# Patient Record
Sex: Male | Born: 1955 | Race: Asian | Hispanic: No | Marital: Married | Smoking: Current some day smoker
Health system: Southern US, Community
[De-identification: ages and names within clinical notes are randomized; demographics above are authoritative.]

---

## 2015-11-18 ENCOUNTER — Ambulatory Visit (INDEPENDENT_AMBULATORY_CARE_PROVIDER_SITE_OTHER): Payer: BLUE CROSS/BLUE SHIELD | Admitting: Family Medicine

## 2015-11-18 ENCOUNTER — Encounter: Payer: Self-pay | Admitting: Family Medicine

## 2015-11-18 VITALS — BP 130/86 | HR 71 | Temp 98.1°F | Resp 17 | Ht 66.0 in | Wt 124.0 lb

## 2015-11-18 DIAGNOSIS — B029 Zoster without complications: Secondary | ICD-10-CM | POA: Diagnosis not present

## 2015-11-18 MED ORDER — TRAMADOL HCL 50 MG PO TABS: 50.0000 mg | ORAL_TABLET | Freq: Three times a day (TID) | ORAL | 0 refills | Status: DC | PRN

## 2015-11-18 MED ORDER — VALACYCLOVIR HCL 1 G PO TABS: 1000.0000 mg | ORAL_TABLET | Freq: Three times a day (TID) | ORAL | 0 refills | Status: DC

## 2015-11-18 NOTE — Patient Instructions (Addendum)
There are 2 medicines.  THe first is called valacyclovir.  Take this 3 times a day for 10 days to keep the rash from spreading.   The other medicine is called Tramadol.  You can take this every 6 hours for pain relief.  If you have mild pain, take the Ibuprofen.    It was good to see you today.  If you are still having issues after 10 days, come back and see us.      IF you received an x-ray today, you will receive an invoice from [facility]. Please contact [facility] at [phone] with questions or concerns regarding your invoice.   IF you received labwork today, you will receive an invoice from [facility]. Please contact [facility] at [phone] with questions or concerns regarding your invoice.   Our billing staff will not be able to assist you with questions regarding bills from these companies.  You will be contacted with the lab results as soon as they are available. The fastest way to get your results is to activate your My Chart account. Instructions are located on the last page of this paperwork. If you have not heard from us regarding the results in 2 weeks, please contact this office.

## 2015-11-18 NOTE — Progress Notes (Signed)
   Derek Lucero is a 60 y.o. male who presents to [facility] today for neck pain and rash:  1.  Neck pain and neck rash:  Present for past week or so. Right side of neck.  Describes pain here and also blisters which started on side of neck, radiates along neck and up to back of his Right ear.  Patient has history of cervical spine neck pain.  Neck pain started first and he assumed this was usual neck pain.  Started used herbal oils and patch over side of his neck as he has done frequently in the past.  He noticed burning after using the oils and patches -- blisters started 2 days after that, which was about 5 days ago. He is taking OTC ibuprofen with some relief of his pain.   No pain in ears.  NO hearing changes.  NO tinnitus.  NO lacrimation issues.  No facial paralysis    PMH:  [place] from [place] on April [place], [place].  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  No known history of being immunocompromised.   ROS as above.  Otherwise, no fevers or chills.  No nausea/vomiting.  No chest pain.  No radiation of pain to extremity.    Family history significant only for MSK pain but otherwise denies any medical concerns.  States parents still alive and healthy.   PMH reviewed. Patient is a nonsmoker.   No past medical history on file. No past surgical history on file.  Medications reviewed. Current Outpatient Prescriptions  Medication Sig Dispense Refill  . glucosamine-chondroitin 500-400 MG tablet Take 1 tablet by mouth 3 (three) times daily.     No current facility-administered medications for this visit.      Physical Exam:  BP 130/86 (BP Location: Right Arm, Patient Position: Sitting, Cuff Size: Normal)   Pulse 71   Temp 98.1 F (36.7 C) (Oral)   Resp 17   Ht 5\' 6"  (1.676 m)   Wt 124 lb (56.2 kg)   SpO2 99%   BMI 20.01 kg/m  Gen:  Alert, cooperative patient who appears stated age in no acute distress.  Vital signs reviewed. HEENT: EOMI,  MMM.  Left ear WNL.  Right ear with some  vesicles noted behind the ear.  NO vesicles on the pinna itself.  No vesicles on face. Neck:  No neck stiffness, supple.   Pulm:  Clear to auscultation bilaterally with good air movement.  No wheezes or rales noted.   Cardiac:  Regular rate and rhythm  Skin:  Multiple patches of erythematous vesicles scattered across Right side of neck and extending up to posterior to RIght ear.  Somewhat painful to touch.  He also has one patch that has extended to level of clavicle.     Assessment and Plan:  1.  Shingles: - vesicular lesions in cervical dermatomal distribution make this most likely to be shingles.  - as it sounds that he's having new blisters, plan to treat with Valtrex.  - Tramadol for pain relief - FU in 10 days if no improvement.  FU sooner if worsening.    Entire visit took 30 minutes of face time to get full history and PMH due to language barrier.

## 2017-02-17 ENCOUNTER — Encounter: Payer: Self-pay | Admitting: Emergency Medicine

## 2017-02-17 ENCOUNTER — Ambulatory Visit (INDEPENDENT_AMBULATORY_CARE_PROVIDER_SITE_OTHER): Payer: BLUE CROSS/BLUE SHIELD | Admitting: Emergency Medicine

## 2017-02-17 ENCOUNTER — Other Ambulatory Visit: Payer: Self-pay

## 2017-02-17 VITALS — BP 130/96 | HR 99 | Temp 97.4°F | Resp 20 | Ht 63.5 in | Wt 132.1 lb

## 2017-02-17 DIAGNOSIS — J209 Acute bronchitis, unspecified: Secondary | ICD-10-CM

## 2017-02-17 DIAGNOSIS — R05 Cough: Secondary | ICD-10-CM | POA: Diagnosis not present

## 2017-02-17 DIAGNOSIS — R059 Cough, unspecified: Secondary | ICD-10-CM | POA: Insufficient documentation

## 2017-02-17 MED ORDER — BENZONATATE 200 MG PO CAPS: 200.0000 mg | ORAL_CAPSULE | Freq: Two times a day (BID) | ORAL | 0 refills | Status: DC | PRN

## 2017-02-17 MED ORDER — AZITHROMYCIN 250 MG PO TABS: ORAL_TABLET | ORAL | 0 refills | Status: DC

## 2017-02-17 NOTE — Progress Notes (Signed)
Derek Lucero 61 y.o.   Chief Complaint  Patient presents with  . Cough     x 1 week    HISTORY OF PRESENT ILLNESS: This is a 61 y.o. male complaining of cough x 1 week. Interpreter Vance [phone] used.  Cough  This is a new problem. The current episode started in the past 7 days. The problem has been gradually worsening. The problem occurs every few minutes. The cough is productive of sputum. Pertinent negatives include no chest pain, chills, ear congestion, fever, headaches, heartburn, hemoptysis, nasal congestion, rash, sore throat, shortness of breath, weight loss or wheezing. He has tried nothing for the symptoms. His past medical history is significant for bronchitis. There is no history of asthma or COPD.     Prior to Admission medications   Medication Sig Start Date End Date Taking? Authorizing Provider  glucosamine-chondroitin 500-400 MG tablet Take 1 tablet by mouth 3 (three) times daily.    [provider]  traMADol (ULTRAM) 50 MG tablet Take 1 tablet (50 mg total) by mouth every 8 (eight) hours as needed. 11/18/15   Tobey Grim, MD  valACYclovir (VALTREX) 1000 MG tablet Take 1 tablet (1,000 mg total) by mouth 3 (three) times daily. 11/18/15   Tobey Grim, MD    No Known Allergies  There are no active problems to display for this patient.   No past medical history on file.    Social History   Socioeconomic History  . Marital status: Married    Spouse name: Not on file  . Number of children: Not on file  . Years of education: Not on file  . Highest education level: Not on file  Social Needs  . Financial resource strain: Not on file  . Food insecurity - worry: Not on file  . Food insecurity - inability: Not on file  . Transportation needs - medical: Not on file  . Transportation needs - non-medical: Not on file  Occupational History  . Not on file  Tobacco Use  . Smoking status: Former Smoker    Types: Cigarettes  . Smokeless tobacco: Never Used    Substance and Sexual Activity  . Alcohol use: Not on file  . Drug use: Not on file  . Sexual activity: Not on file  Other Topics Concern  . Not on file  Social History Narrative  . Not on file    No family history on file.   Review of Systems  Constitutional: Negative.  Negative for chills, fever and weight loss.  HENT: Negative.  Negative for congestion, sinus pain and sore throat.   Eyes: Negative.   Respiratory: Positive for cough and sputum production. Negative for hemoptysis, shortness of breath and wheezing.   Cardiovascular: Negative for chest pain and palpitations.  Gastrointestinal: Negative for abdominal pain, diarrhea, heartburn, nausea and vomiting.  Genitourinary: Negative.  Negative for dysuria and hematuria.  Musculoskeletal: Negative.   Skin: Negative.  Negative for rash.  Neurological: Negative for dizziness and headaches.  Endo/Heme/Allergies: Negative.   All other systems reviewed and are negative.   Vitals:   02/17/17 1400  BP: (!) 130/96  Pulse: 99  Resp: 20  Temp: (!) 97.4 F (36.3 C)  SpO2: 98%    Physical Exam  Constitutional: He is oriented to person, place, and time. He appears well-developed and well-nourished.  HENT:  Head: Normocephalic and atraumatic.  Right Ear: External ear normal.  Left Ear: External ear normal.  Nose: Nose normal.  Mouth/Throat: Oropharynx  is clear and moist.  Eyes: Conjunctivae and EOM are normal. Pupils are equal, round, and reactive to light.  Neck: Normal range of motion. Neck supple. No JVD present. No thyromegaly present.  Cardiovascular: Normal rate, regular rhythm, normal heart sounds and intact distal pulses.  Pulmonary/Chest: Effort normal and breath sounds normal. No respiratory distress.  Abdominal: Soft. Bowel sounds are normal. He exhibits no distension. There is no tenderness.  Musculoskeletal: Normal range of motion.  Lymphadenopathy:    He has no cervical adenopathy.  Neurological: He is alert  and oriented to person, place, and time. No sensory deficit. He exhibits normal muscle tone. Coordination normal.  Skin: Skin is warm and dry. Capillary refill takes less than 2 seconds. No rash noted.  Psychiatric: He has a normal mood and affect. His behavior is normal.  Vitals reviewed.    ASSESSMENT & PLAN: [name] was seen today for cough.  Diagnoses and all orders for this visit:  Acute bronchitis, unspecified organism -     azithromycin (ZITHROMAX) 250 MG tablet; Sig as indicated  Cough -     benzonatate (TESSALON) 200 MG capsule; Take 1 capsule (200 mg total) by mouth 2 (two) times daily as needed for cough.    Patient Instructions  Acute Bronchitis, Adult Acute bronchitis is when air tubes (bronchi) in the lungs suddenly get swollen. The condition can make it hard to breathe. It can also cause these symptoms:  A cough.  Coughing up clear, yellow, or green mucus.  Wheezing.  Chest congestion.  Shortness of breath.  A fever.  Body aches.  Chills.  A sore throat.  Follow these instructions at home: Medicines  Take over-the-counter and prescription medicines only as told by your doctor.  If you were prescribed an antibiotic medicine, take it as told by your doctor. Do not stop taking the antibiotic even if you start to feel better. General instructions  Rest.  Drink enough fluids to keep your pee (urine) clear or pale yellow.  Avoid smoking and secondhand smoke. If you smoke and you need help quitting, ask your doctor. Quitting will help your lungs heal faster.  Use an inhaler, cool mist vaporizer, or humidifier as told by your doctor.  Keep all follow-up visits as told by your doctor. This is important. How is this prevented? To lower your risk of getting this condition again:  Wash your hands often with soap and water. If you cannot use soap and water, use hand sanitizer.  Avoid contact with people who have cold symptoms.  Try not to touch your  hands to your mouth, nose, or eyes.  Make sure to get the flu shot every year.  Contact a doctor if:  Your symptoms do not get better in 2 weeks. Get help right away if:  You cough up blood.  You have chest pain.  You have very bad shortness of breath.  You become dehydrated.  You faint (pass out) or keep feeling like you are going to pass out.  You keep throwing up (vomiting).  You have a very bad headache.  Your fever or chills gets worse. This information is not intended to replace advice given to you by your health care provider. Make sure you discuss any questions you have with your health care provider. Document Released: 07/26/2007 Document Revised: 09/15/2015 Document Reviewed: 07/28/2015 Elsevier Interactive Patient Education  2018 Elsevier Inc.  Vim ph? qu?n c?p tnh, Ng??i l?n Acute Bronchitis, Adult Vim ph? qu?n c?p tnh l tnh tr?ng  s?ng ??t ng?t (c?p tnh) cc ?ng d?n kh (ph? qu?n) trong ph?i. Vim ph? qu?n c?p tnh lm cho cc ?ng ny ??y d?ch nh?y, ?i?u ny c th? d?n ??n kh th?. B?nh c?ng c th? gy ho ho?c th? kh kh. ? ng??i l?n, vim ph? qu?n c?p tnh th??ng kh?i trong vng 2 tu?n. Ho do vim ph? qu?n c th? ko di ??n 3 tu?n. Ht thu?c, d? ?ng, v hen suy?n c th? lm cho tnh tr?ng ny tr?m tr?ng h?n. Nh?ng ??t vim ph? qu?n l?p ?i l?p l?i c th? gy thm cc v?n ?? ? ph?i, ch?ng h?n nh? b?nh ph?i t?c ngh?n m?n tnh (COPD). Nguyn nhn g gy ra? Tnh tr?ng ny c th? do cc m?m b?nh v cc ch?t gy kch ?ng ph?i gy ra, bao g?m:  Vi rt gy c?m l?nh v cm. Tnh tr?ng ny ph?n l?n th??ng do cng m?t lo?i vi rt gy c?m l?nh gy ra.  Vi khu?n.  Ti?p xc v?i khi thu?c l, b?i, khi v  nhi?m khng kh.  ?i?u g lm t?ng nguy c?? Tnh tr?ng ny hay x?y ra h?n ? nh?ng ng??i:  Ti?p xc g?n g?i v?i ai ? b? vim ph? qu?n c?p tnh.  Ti?p xc v?i nh?ng ch?t gy kch ?ng ph?i, ch?ng h?n nh? khi thu?c l, b?i, khi v h?i n??c.  C h? mi?n d?ch  y?u.  C tnh tr?ng b?nh l v? h h?p nh? hen suy?n.  Cc d?u hi?u ho?c tri?u ch?ng l g? Nh?ng tri?u ch?ng c?a tnh tr?ng ny bao g?m:  Ho.  Ho ra d?ch nh?y trong, mu vng ho?c mu xanh l cy.  Th? kh kh.  T?c ng?c.  Kh th?.  S?t.  ?au nh?c c? th?.  ?n l?nh.  ?au h?ng.  Ch?n ?on tnh tr?ng ny nh? th? no? Tnh tr?ng ny th???ng ???c ch?n ?on b?ng cch khm th?c th?Suzzette Righter qu trnh Loren Racer, chuyn gia ch?m [place] s?c kh?e c?a qu v? c th? yu c?u cc xt nghi?m, ch?ng h?n nh? ch?p X-quang ng?c, ?? lo?i b? cc tnh tr?ng khc. Chuyn gia c?ng c th?:  Xt nghi?m m?u d?ch nh?y ?? xem c b? nhi?m vi khu?n khng.  Ki?m tra n?ng ?? oxi trong mu. Vi?c ny ???c th?c hi?n ?? xem tr? c b? vim ph?i khng.  Ch?p X-quang ng?c ho?c ki?m tra ch?c n?ng ph?i ?? lo?i tr? vim ph?i v nh?ng tnh tr?ng khc.  Xt nghi?m mu.  Chuyn gia ch?m [place] s?c kh?e c?a qu v? c?ng s? h?i v? cc tri?u ch?ng v b?nh s? c?a qu v?. Tnh tr?ng ny ???c ?i?u tr? nh? th? no? H?u h?t cc tr??ng h?p vim ph? qu?n c?p tnh ??u kh?i theo th?i gian m khng c?n ?i?u tr?Shaune Pascal gia ch?m [place] s?c kh?e c?a qu v? c th? khuy?n ngh?:  U?ng nhi?u n??c h?n. U?ng nhi?u n??c h?n gip d?ch nh?y long h?n, c th? lm cho qu v? th? d? dng h?n.  U?ng thu?c gi?m s?t ho?c gi?m ho.  U?ng thu?c khng sinh.  S? d?ng my kh dung ?? c?i thi?n tnh tr?ng kh th? v ki?m sot ho.  S? d?ng my phun h?i n??c mt ho?c my ?i?u ?m ?? lm cho qu v? th? d? dng h?n.  Tun th? nh?ng h??ng d?n ny ? nh: Thu?c  Ch? s? d?ng thu?c khng k ??n v thu?c k ??n theo ch? d?n c?a chuyn gia ch?m [place] s?c kh?e.  N?u qu v? ???c k thu?c khng sinh, [place] dng thu?c theo ch? d?n c?a chuyn gia ch?m [place] s?c kh?e. Khng d?ng u?ng thu?c khng sinh ngay c? khi qu v? b?t ??u c?m th?y ?? h?n. H??ng d?n chung  Ngh? ng?i th?t nhi?u.  U?ng ?? n??c ?? gi? cho n??c ti?u trong ho?c vng nh?t.  Trnh ht thu?c l v khi thu?c l th?  ??ng. Ti?p xc v?i khi thu?c l ho?c cc ha ch?t kch thch s? lm cho vim ph? qu?n n?ng h?n. N?u qu v? ht thu?c v c?n gip ?? ?? b? thu?c l, [place] h?i chuyn gia ch?m [place] s?c kh?e. B? ht thu?c s? gip ph?i bnh ph?c nhanh h?n.  S? d?ng my kh dung, my phun h?i n??c mt ho?c my ?i?u ?m theo ch? d?n c?a chuyn gia ch?m [place] s?c kh?e.  Tun th? t?t c? cc l?n khm theo di theo ch? d?n c?a chuyn gia ch?m [place] s?c kh?e. ?i?u ny c vai tr quan tr?ng. Ng?n ng?a tnh tr?ng ny b?ng cch no? ?? gi?m nguy c? m?c l?i tnh tr?ng ny:  R?a tay th???ng xuyn b?ng x phng v n??c. N?u khng c x phng v n??c, Jamill dng thu?c st trng tay.  Trnh ti?p xc v?i nh?ng ng??i c tri?u ch?ng c?m l?nh.  C? g?ng khng ch?m tay ln mi?ng, m?i, ho?c m?t.  ??m b?o tim phng cm hng n?m.  Aahil lin l?c v?i chuyn gia ch?m [place] s?c kh?e n?u:  Cc tri?u ch?ng c?a qu v? khng c?i thi?n sau 2 tu?n ?i?u tr?Cathie Hoops. Yu c?u tr? gip ngay l?p t?c n?u:  Qu v? ho ra mu.  Qu v? b? ?au ng?c.  Qu v? b? kh th? r?t nhi?u.  Qu v? b? m?t n??c.  Qu v? ng?t x?u ho?c qu v? c?m th?y nh? l s?p ng?t x?u.  Qu v? ti?p t?c nn m?a.  Qu v? b? ?au ??u nhi?u.  Tnh tr?ng s?t ho?c ?n l?nh c?a qu v? tr?m tr?ng h?n. Thng tin ny khng nh?m m?c ?ch thay th? cho l?i khuyn m chuyn gia ch?m [place] s?c kh?e ni v?i qu v?. [place] b?o ??m qu v? ph?i th?o lu?n b?t k? v?n ?? g m qu v? c v?i chuyn gia ch?m  s?c kh?e c?a qu v?. Document Released: 03/05/2015 Document Revised: 05/12/2016 Document Reviewed: 07/28/2015 Elsevier Interactive Patient Education  2018 Elsevier Inc.      Edwina BarthMiguel Jowanna Loeffler, MD [facility]

## 2017-02-17 NOTE — Patient Instructions (Addendum)
Acute Bronchitis, Adult Acute bronchitis is when air tubes (bronchi) in the lungs suddenly get swollen. The condition can make it hard to breathe. It can also cause these symptoms:  A cough.  Coughing up clear, yellow, or green mucus.  Wheezing.  Chest congestion.  Shortness of breath.  A fever.  Body aches.  Chills.  A sore throat.  Follow these instructions at home: Medicines  Take over-the-counter and prescription medicines only as told by your doctor.  If you were prescribed an antibiotic medicine, take it as told by your doctor. Do not stop taking the antibiotic even if you start to feel better. General instructions  Rest.  Drink enough fluids to keep your pee (urine) clear or pale yellow.  Avoid smoking and secondhand smoke. If you smoke and you need help quitting, ask your doctor. Quitting will help your lungs heal faster.  Use an inhaler, cool mist vaporizer, or humidifier as told by your doctor.  Keep all follow-up visits as told by your doctor. This is important. How is this prevented? To lower your risk of getting this condition again:  Wash your hands often with soap and water. If you cannot use soap and water, use hand sanitizer.  Avoid contact with people who have cold symptoms.  Try not to touch your hands to your mouth, nose, or eyes.  Make sure to get the flu shot every year.  Contact a doctor if:  Your symptoms do not get better in 2 weeks. Get help right away if:  You cough up blood.  You have chest pain.  You have very bad shortness of breath.  You become dehydrated.  You faint (pass out) or keep feeling like you are going to pass out.  You keep throwing up (vomiting).  You have a very bad headache.  Your fever or chills gets worse. This information is not intended to replace advice given to you by your health care provider. Make sure you discuss any questions you have with your health care provider. Document Released:  07/26/2007 Document Revised: 09/15/2015 Document Reviewed: 07/28/2015 Elsevier Interactive Patient Education  2018 Elsevier Inc.  Vim ph? qu?n c?p tnh, Ng??i l?n Acute Bronchitis, Adult Vim ph? qu?n c?p tnh l tnh tr?ng s?ng ??t ng?t (c?p tnh) cc ?ng d?n kh (ph? qu?n) trong ph?i. Vim ph? qu?n c?p tnh lm cho cc ?ng ny ??y d?ch nh?y, ?i?u ny c th? d?n ??n kh th?. B?nh c?ng c th? gy ho ho?c th? kh kh. ? ng??i l?n, vim ph? qu?n c?p tnh th??ng kh?i trong vng 2 tu?n. Ho do vim ph? qu?n c th? ko di ??n 3 tu?n. Ht thu?c, d? ?ng, v hen suy?n c th? lm cho tnh tr?ng ny tr?m tr?ng h?n. Nh?ng ??t vim ph? qu?n l?p ?i l?p l?i c th? gy thm cc v?n ?? ? ph?i, ch?ng h?n nh? b?nh ph?i t?c ngh?n m?n tnh (COPD). Nguyn nhn g gy ra? Tnh tr?ng ny c th? do cc m?m b?nh v cc ch?t gy kch ?ng ph?i gy ra, bao g?m:  Vi rt gy c?m l?nh v cm. Tnh tr?ng ny ph?n l?n th??ng do cng m?t lo?i vi rt gy c?m l?nh gy ra.  Vi khu?n.  Ti?p xc v?i khi thu?c l, b?i, khi v  nhi?m khng kh.  ?i?u g lm t?ng nguy c?? Tnh tr?ng ny hay x?y ra h?n ? nh?ng ng??i:  Ti?p xc g?n g?i v?i ai ? b? vim ph? qu?n c?p tnh.  Ti?p xc  v?i nh?ng ch?t gy kch ?ng ph?i, ch?ng h?n nh? khi thu?c l, b?i, khi v h?i n??c.  C h? mi?n d?ch y?u.  C tnh tr?ng b?nh l v? h h?p nh? hen suy?n.  Cc d?u hi?u ho?c tri?u ch?ng l g? Nh?ng tri?u ch?ng c?a tnh tr?ng ny bao g?m:  Ho.  Ho ra d?ch nh?y trong, mu vng ho?c mu xanh l cy.  Th? kh kh.  T?c ng?c.  Kh th?.  S?t.  ?au nh?c c? th?.  ?n l?nh.  ?au h?ng.  Ch?n ?on tnh tr?ng ny nh? th? no? Tnh tr?ng ny th???ng ???c ch?n ?on b?ng cch khm th?c th?Suzzette Righter. Trong qu trnh Loren Racerkhm, chuyn gia ch?m [place] s?c kh?e c?a qu v? c th? yu c?u cc xt nghi?m, ch?ng h?n nh? ch?p X-quang ng?c, ?? lo?i b? cc tnh tr?ng khc. Chuyn gia c?ng c th?:  Xt nghi?m m?u d?ch nh?y ?? xem c b? nhi?m vi khu?n khng.  Ki?m tra  n?ng ?? oxi trong mu. Vi?c ny ???c th?c hi?n ?? xem tr? c b? vim ph?i khng.  Ch?p X-quang ng?c ho?c ki?m tra ch?c n?ng ph?i ?? lo?i tr? vim ph?i v nh?ng tnh tr?ng khc.  Xt nghi?m mu.  Chuyn gia ch?m [place] s?c kh?e c?a qu v? c?ng s? h?i v? cc tri?u ch?ng v b?nh s? c?a qu v?. Tnh tr?ng ny ???c ?i?u tr? nh? th? no? H?u h?t cc tr??ng h?p vim ph? qu?n c?p tnh ??u kh?i theo th?i gian m khng c?n ?i?u tr?Shaune Pascal. Chuyn gia ch?m [place] s?c kh?e c?a qu v? c th? khuy?n ngh?:  U?ng nhi?u n??c h?n. U?ng nhi?u n??c h?n gip d?ch nh?y long h?n, c th? lm cho qu v? th? d? dng h?n.  U?ng thu?c gi?m s?t ho?c gi?m ho.  U?ng thu?c khng sinh.  S? d?ng my kh dung ?? c?i thi?n tnh tr?ng kh th? v ki?m sot ho.  S? d?ng my phun h?i n??c mt ho?c my ?i?u ?m ?? lm cho qu v? th? d? dng h?n.  Tun th? nh?ng h??ng d?n ny ? nh: Thu?c  Ch? s? d?ng thu?c khng k ??n v thu?c k ??n theo ch? d?n c?a chuyn gia ch?m [place] s?c kh?e.  N?u qu v? ???c k thu?c khng sinh, [place] dng thu?c theo ch? d?n c?a chuyn gia ch?m [place] s?c kh?e. Khng d?ng u?ng thu?c khng sinh ngay c? khi qu v? b?t ??u c?m th?y ?? h?n. H??ng d?n chung  Ngh? ng?i th?t nhi?u.  U?ng ?? n??c ?? gi? cho n??c ti?u trong ho?c vng nh?t.  Trnh ht thu?c l v khi thu?c l th? ??ng. Ti?p xc v?i khi thu?c l ho?c cc ha ch?t kch thch s? lm cho vim ph? qu?n n?ng h?n. N?u qu v? ht thu?c v c?n gip ?? ?? b? thu?c l, Naif h?i chuyn gia ch?m [place] s?c kh?e. B? ht thu?c s? gip ph?i bnh ph?c nhanh h?n.  S? d?ng my kh dung, my phun h?i n??c mt ho?c my ?i?u ?m theo ch? d?n c?a chuyn gia ch?m [place] s?c kh?e.  Tun th? t?t c? cc l?n khm theo di theo ch? d?n c?a chuyn gia ch?m [place] s?c kh?e. ?i?u ny c vai tr quan tr?ng. Ng?n ng?a tnh tr?ng ny b?ng cch no? ?? gi?m nguy c? m?c l?i tnh tr?ng ny:  R?a tay th???ng xuyn b?ng x phng v n??c. N?u khng c x phng v n??c, Meryl dng thu?c st trng  tay.  Young Berryrnh  ti?p xc v?i nh?ng ng??i c tri?u ch?ng c?m l?nh.  C? g?ng khng ch?m tay ln mi?ng, m?i, ho?c m?t.  ??m b?o tim phng cm hng n?m.  [place] lin l?c v?i chuyn gia ch?m [place] s?c kh?e n?u:  Cc tri?u ch?ng c?a qu v? khng c?i thi?n sau 2 tu?n ?i?u tr?Cathie Hoops c?u tr? gip ngay l?p t?c n?u:  Qu v? ho ra mu.  Qu v? b? ?au ng?c.  Qu v? b? kh th? r?t nhi?u.  Qu v? b? m?t n??c.  Qu v? ng?t x?u ho?c qu v? c?m th?y nh? l s?p ng?t x?u.  Qu v? ti?p t?c nn m?a.  Qu v? b? ?au ??u nhi?u.  Tnh tr?ng s?t ho?c ?n l?nh c?a qu v? tr?m tr?ng h?n. Thng tin ny khng nh?m m?c ?ch thay th? cho l?i khuyn m chuyn gia ch?m [place] s?c kh?e ni v?i qu v?. [place] b?o ??m qu v? ph?i th?o lu?n b?t k? v?n ?? g m qu v? c v?i chuyn gia ch?m [place] s?c kh?e c?a qu v?. Document Released: 03/05/2015 Document Revised: 05/12/2016 Document Reviewed: 07/28/2015 Elsevier Interactive Patient Education  2018 [employer].

## 2017-04-04 ENCOUNTER — Encounter: Payer: Self-pay | Admitting: Emergency Medicine

## 2017-04-04 ENCOUNTER — Ambulatory Visit (INDEPENDENT_AMBULATORY_CARE_PROVIDER_SITE_OTHER): Payer: BLUE CROSS/BLUE SHIELD | Admitting: Emergency Medicine

## 2017-04-04 ENCOUNTER — Ambulatory Visit (INDEPENDENT_AMBULATORY_CARE_PROVIDER_SITE_OTHER): Payer: BLUE CROSS/BLUE SHIELD

## 2017-04-04 ENCOUNTER — Other Ambulatory Visit: Payer: Self-pay

## 2017-04-04 VITALS — BP 140/86 | HR 65 | Temp 98.1°F | Resp 16 | Ht 63.5 in | Wt 131.4 lb

## 2017-04-04 DIAGNOSIS — M25512 Pain in left shoulder: Secondary | ICD-10-CM

## 2017-04-04 DIAGNOSIS — R079 Chest pain, unspecified: Secondary | ICD-10-CM | POA: Insufficient documentation

## 2017-04-04 DIAGNOSIS — M549 Dorsalgia, unspecified: Secondary | ICD-10-CM | POA: Diagnosis not present

## 2017-04-04 DIAGNOSIS — M7918 Myalgia, other site: Secondary | ICD-10-CM | POA: Insufficient documentation

## 2017-04-04 DIAGNOSIS — M25511 Pain in right shoulder: Secondary | ICD-10-CM | POA: Insufficient documentation

## 2017-04-04 NOTE — Progress Notes (Signed)
Yandriel Cangelosi 62 y.o.   Chief Complaint  Patient presents with  . Chest Pain    x 2 days  . Shoulder Pain    LEFT    HISTORY OF PRESENT ILLNESS: This is a 62 y.o. male with no significant past medical history complaining of a 2-day history of constant pain to the left upper back left shoulder left side of the chest with no other significant symptomatology.  Denies injuries.  Pain is steady and sharp.  Worse with movement.  Seems comfortable at present time.  Speaks [place]  Interpreter Elease Hashimoto) used during the interview.  HPI   Prior to Admission medications   Medication Sig Start Date End Date Taking? Authorizing Provider  glucosamine-chondroitin 500-400 MG tablet Take 1 tablet by mouth 3 (three) times daily.   Yes [provider]  IBUPROFEN PO Take by mouth daily.   Yes [provider]  azithromycin (ZITHROMAX) 250 MG tablet Sig as indicated Patient not taking: Reported on 04/04/2017 02/17/17   Georgina Quint, MD  benzonatate (TESSALON) 200 MG capsule Take 1 capsule (200 mg total) by mouth 2 (two) times daily as needed for cough. Patient not taking: Reported on 04/04/2017 02/17/17   Georgina Quint, MD  dextromethorphan [facility]) 30 MG/5ML liquid Take 30 mg by mouth as needed for cough.    [provider]  traMADol (ULTRAM) 50 MG tablet Take 1 tablet (50 mg total) by mouth every 8 (eight) hours as needed. Patient not taking: Reported on 02/17/2017 11/18/15   Tobey Grim, MD  valACYclovir (VALTREX) 1000 MG tablet Take 1 tablet (1,000 mg total) by mouth 3 (three) times daily. Patient not taking: Reported on 02/17/2017 11/18/15   Tobey Grim, MD    No Known Allergies  Patient Active Problem List   Diagnosis Date Noted  . Cough 02/17/2017  . Acute bronchitis 02/17/2017    No past medical history on file.    Social History   Socioeconomic History  . Marital status: Married    Spouse name: Not on file  . Number of children:  Not on file  . Years of education: Not on file  . Highest education level: Not on file  Social Needs  . Financial resource strain: Not on file  . Food insecurity - worry: Not on file  . Food insecurity - inability: Not on file  . Transportation needs - medical: Not on file  . Transportation needs - non-medical: Not on file  Occupational History  . Not on file  Tobacco Use  . Smoking status: Former Smoker    Types: Cigarettes  . Smokeless tobacco: Never Used  Substance and Sexual Activity  . Alcohol use: Not on file  . Drug use: Not on file  . Sexual activity: Not on file  Other Topics Concern  . Not on file  Social History Narrative  . Not on file    No family history on file.   Review of Systems  Constitutional: Negative.  Negative for chills and fever.  HENT: Negative.  Negative for congestion, nosebleeds and sore throat.   Eyes: Negative.  Negative for discharge and redness.  Respiratory: Negative.  Negative for cough and shortness of breath.   Cardiovascular: Positive for chest pain. Negative for palpitations, claudication and leg swelling.  Gastrointestinal: Negative for abdominal pain, diarrhea, nausea and vomiting.  Musculoskeletal: Positive for back pain and joint pain.  Skin: Negative.  Negative for rash.  Neurological: Negative.  Negative for dizziness and headaches.  Endo/Heme/Allergies: Negative.   All other systems reviewed and are negative.   Vitals:   04/04/17 1156  BP: 140/86  Pulse: 65  Resp: 16  Temp: 98.1 F (36.7 C)  SpO2: 99%    Physical Exam  Constitutional: He is oriented to person, place, and time. He appears well-developed and well-nourished.  HENT:  Head: Normocephalic and atraumatic.  Right Ear: External ear normal.  Left Ear: External ear normal.  Nose: Nose normal.  Mouth/Throat: Oropharynx is clear and moist.  Eyes: Conjunctivae and EOM are normal. Pupils are equal, round, and reactive to light.  Neck: Normal range of motion.  Neck supple. No JVD present. No thyromegaly present.  Cardiovascular: Normal rate, regular rhythm, normal heart sounds and intact distal pulses.  Pulmonary/Chest: Effort normal and breath sounds normal. He exhibits no tenderness.  Abdominal: Soft. Bowel sounds are normal. There is no tenderness.  Musculoskeletal: Normal range of motion. He exhibits no tenderness.  Left shoulder: Full range of motion, no tenderness, no bruising, no swelling. Upper back: Mild tenderness to left trapezius.  Otherwise unremarkable.  Lymphadenopathy:    He has no cervical adenopathy.  Neurological: He is alert and oriented to person, place, and time. No sensory deficit. He exhibits normal muscle tone.  Skin: Skin is warm and dry. Capillary refill takes less than 2 seconds. No rash noted.  Psychiatric: He has a normal mood and affect. His behavior is normal.  Vitals reviewed.  NSR with VR59 No acute ischemic changes PR140 QRS100.  CXR: NAD.  Dg Chest 2 View  Result Date: 04/04/2017 CLINICAL DATA:  Chest pain. EXAM: CHEST  2 VIEW COMPARISON:  None. FINDINGS: The cardiac silhouette, mediastinal and hilar contours are within normal limits. There is mild tortuosity and ectasia of the thoracic aorta. Mild bronchitic lung changes with mild peribronchial thickening and increased interstitial markings which could reflect smoking changes or bronchitis. No infiltrates or effusions. No worrisome pulmonary lesions. The bony thorax is intact. IMPRESSION: Bronchitic type changes, likely related to smoking or bronchitis. No infiltrates or effusions. Electronically Signed   By: Rudie Meyer M.D.   On: 04/04/2017 12:28    ASSESSMENT & PLAN: Wray was seen today for chest pain and shoulder pain.  Diagnoses and all orders for this visit:  Chest pain, unspecified type -     Comprehensive metabolic panel -     Lipid panel -     CBC with Differential/Platelet -     DG Chest 2 View; Future -     EKG 12-Lead  Upper back pain on  left side  Acute pain of left shoulder  Musculoskeletal pain  Other orders -     Cancel: TSH    Patient Instructions       IF you received an x-ray today, you will receive an invoice from [facility]. Please contact [facility] at [phone] with questions or concerns regarding your invoice.   IF you received labwork today, you will receive an invoice from [facility]. Please contact [facility] at [phone] with questions or concerns regarding your invoice.   Our billing staff will not be able to assist you with questions regarding bills from these companies.  You will be contacted with the lab results as soon as they are available. The fastest way to get your results is to activate your My Chart account. Instructions are located on the last page of this paperwork. If you have not heard from [place] regarding the results in 2 weeks, please contact this office.  Nonspecific Chest Pain Chest pain can be caused by many different conditions. There is a chance that your pain could be related to something serious, such as a heart attack or a blood clot in your lungs. Chest pain can also be caused by conditions that are not life-threatening. If you have chest pain, it is very important to follow up with your doctor. Follow these instructions at home: Medicines  If you were prescribed an antibiotic medicine, take it as told by your doctor. Do not stop taking the antibiotic even if you start to feel better.  Take over-the-counter and prescription medicines only as told by your doctor. Lifestyle  Do not use any products that contain nicotine or tobacco, such as cigarettes and e-cigarettes. If you need help quitting, ask your doctor.  Do not drink alcohol.  Make lifestyle changes as told by your doctor. These may include: ? Getting regular exercise. Ask your doctor for some activities that are safe for you. ? Eating a heart-healthy diet. A diet specialist (dietitian)  can help you to learn healthy eating options. ? Staying at a healthy weight. ? Managing diabetes, if needed. ? Lowering your stress, as with deep breathing or spending time in nature. General instructions  Avoid any activities that make you feel chest pain.  If your chest pain is because of heartburn: ? Raise (elevate) the head of your bed about 6 inches (15 cm). You can do this by putting blocks under the bed legs at the head of the bed. ? Do not sleep with extra pillows under your head. That does not help heartburn.  Keep all follow-up visits as told by your doctor. This is important. This includes any further testing if your chest pain does not go away. Contact a doctor if:  Your chest pain does not go away.  You have a rash with blisters on your chest.  You have a fever.  You have chills. Get help right away if:  Your chest pain is worse.  You have a cough that gets worse, or you cough up blood.  You have very bad (severe) pain in your belly (abdomen).  You are very weak.  You pass out (faint).  You have either of these for no clear reason: ? Sudden chest discomfort. ? Sudden discomfort in your arms, back, neck, or jaw.  You have shortness of breath at any time.  You suddenly start to sweat, or your skin gets clammy.  You feel sick to your stomach (nauseous).  You throw up (vomit).  You suddenly feel light-headed or dizzy.  Your heart starts to beat fast, or it feels like it is skipping beats. These symptoms may be an emergency. Do not wait to see if the symptoms will go away. Get medical help right away. Call your local emergency services (911 in the [place] Do not drive yourself to the hospital. This information is not intended to replace advice given to you by your health care provider. Make sure you discuss any questions you have with your health care provider. Document Released: 07/26/2007 Document Revised: 11/01/2015 Document Reviewed: 11/01/2015 Elsevier  Interactive Patient Education  2017 Elsevier Inc.  ?au ng?c khng ??c hi?u (Nonspecific Chest Pain) ?au ng??c khng ???c hi?u co? th? do nhi?u ti?nh tra?ng b?nh ly? kha?c nhau. Lun lun co? m?t kha? n?ng la? c?n ?au c?a quy? vi? c th? lin quan ??n m?t b?nh na?o ? nghim tr?ng, ch?ng h?n nh? nh?i ma?u c? tim ho?c m?t c?c mu ?ng  trong ph?i c?a quy? vi?. ?au ng?c c?ng c th? do ca?c ti?nh tra?ng b?nh ly? khng ?e do?a ma?ng s?ng gy ra. N?u quy? vi? bi? ?au ng?c, m?t ?i?u r?t quan tr?ng la? quy? vi? pha?i kha?m la?i v??i chuyn gia ch?m [place] s?c kh?e c?a quy? vi?. NGUYN NHN ?au ng?c c th? ???c gy ra b?i:  ? nng.  Vim ph?i ho?c vim ph? qu?n.  Lo l?ng hay c?ng th?ng.  Vim xung quanh tim cu?a quy? vi? (vim ma?ng ngoa?i tim) ho?c ph?i (vim ma?ng ph?i ho?c vim mng ph?i).  M?t c?c mu ?ng trong ph?i c?a quy? vi?.  Ph?i xe?p (trn khi? ma?ng ph?i). N c th? t?? pht tri?n b?t ng? (tra?n khi? ma?ng ph?i t?? pha?t) ho?c do ch?n th??ng ng?c.  Nhi?m b?nh gi??i leo (vi ru?t varicella - zoster).  Nh?i mu c? tim.  T?n h?i x??ng, c? b?p v s?n t?o nn tha?nh ng?c c?a quy? vi?. T?n ha?i c th? bao g?m: ? X??ng thm ti?m do ch?n th??ng. ? C? ho?c s?n bi? ke?o c?ng do ho th??ng xuyn hay l?p ?i l?p l?i ho?c lm vi?c qu s?c. ? Gy m?t ho?c nhi?u x??ng s??n. ? S?n b? ?au do vim (vim s?n s??n). CC Y?U T? NGUY C? Ca?c y?u t? nguy c? gy ?au ng?c c th? bao g?m:  Cc ho?t ??ng lm t?ng nguy c? ch?n th??ng ho?c th??ng t?t cho ng??c cu?a quy? vi?.  Nhi?m trng h h?p ho??c ca?c b?nh ly? gy ra ho th??ng xuyn.  Ca?c ti?nh tra?ng b?nh ly? ho?c ?n qu nhi?u c th? gy ra ch?ng ? nng.  Bi? b?nh tim ho??c co? ti?n s? gia ?nh b? b?nh tim.  Ca?c b?nh ly? ho?c hnh vi s?c kh?e lm t?ng nguy c? hi?nh tha?nh c?c mu ?ng.  Bi? b?nh thu?y ??u (varicella zoster). D?U HI?U V TRI?U CH?NG ?au ng?c c th? th?y gi?ng nh?:  No?ng ho?c ng?a ran trn ng?c ho?c  su trong l?ng ng?c c?a quy? vi?.  ?au nh? bi? ?e? e?p, ?au t??c, ?au nho?i ho?c ?au nh? bi? si?t ch??t.  ?au m i? ho??c ?au nho?i n??ng h?n khi quy? vi? di chuy?n, ho ho?c th? su.  C?n ?au cu?ng co? th? c?m th?y ? l?ng, c?, vai ho?c cnh tay, ho?c c?n ?au ly lan ??n b?t k? khu v??c na?o trong s? cc khu v?c ny. C?n ?au ng?c c th? ??n v ?i, ho?c n c th? ti?p di?n lin t?c. CH?N ?ON Co? th? c?n la?m ca?c xe?t nghi?m ho?c nghin c?u khc ?? tm nguyn nhn gy ?au. Chuyn gia ch?m [place] s?c kh?e c th? cho qu v? lm m?t ki?m tra g?i l ?i?n tm ?? l?u ??ng ECG (?i?n tm ??). ECG ghi la?i nh?p tim c?a quy? vi? t?i th?i ?i?m th??c hi?n ki?m tra. Qu v? c?ng c th? ph?i lm cc ki?m tra khc, ch?ng h?n nh?:  Siu m tim qua thnh ng?c (TTE). Trong siu m qua tha?nh ng??c, sng m thanh ???c s? d?ng ?? t?o ra hnh ?nh c?a t?t c? cc c?u trc tim v xem do?ng mu ch?y qua tim nh? th? na?o.  Siu m tim qua th??c qua?n (TEE).?y l m?t ki?m tra hnh ?nh nng cao thu ca?c hnh ?nh t? bn trong c? th? c?a quy? vi?. N cho php chuyn gia ch?m [place] s??c kho?e nhi?n tim c?a quy? vi? chi ti?t h?n.  Theo di tim. Bi?n php ny cho php chuyn gia ch?m  [place] s?c kh?e theo di nh?p v nh?p ?i?u c?a tim theo th?i gian th?c.  My ?i?n tim ([place]). ?y l m?t thi?t b? c?m tay ghi l?i nh?p tim cu?a quy? vi? v c th? gip ch?n ?on r?i lo?n nh?p tim. N cho php chuyn gia ch?m [place] s?c kh?e theo di ho?t ??ng tim c?a qu v? trong vi ngy, n?u c?n.  Nghi?m php g?ng s?c. Nh??ng ki?m tra na?y c th? ???c th?c hi?n thng qua t?p th? d?c ho?c cho dng thu?c lm cho tim quy? vi? ??p nhanh h?n.  Xt nghi?m mu.  Cc ki?m tra b??ng hnh ?nh. ?I?U TR? ?i?u tr? ph? thu?c [place] y?u t? gy ra c?n ?au ng?c c?a quy? vi?. ?i?u tr? c th? bao g?m:  Thu?c. Thu?c c th? bao g?m: ? Thu?c ch??n a xi?t ?? ch?ng ch?ng ? nng. ? Thu?c ch?ng vim. ? Thu?c gia?m ?au cho ca?c b?nh vim. ? Thu?c khng  sinh n?u c nhi?m trng. ? Thu?c la?m tan c?c mu ?ng. ? Thu?c ?? ?i?u tr? b?nh ??ng m?ch vnh.  Ch?m [place] h? tr? cho cc b?nh ly? khng c?n thu?c. Vi?c ny c th? bao g?m: ? Ngh? ng?i. ? Ch???m no?ng ho?c l?nh va?o ca?c khu v??c bi? t?n th??ng. ? H?n ch? ho?t ??ng cho ??n khi c?n ?au gi?m. H??NG D?N CH?M [place] T?I NH  N?u qu v? ???c k ??n dng thu?c khng sinh, [place] dng h?t thu?c ngay c? khi qu v? b?t ??u c?m th?y ?? h?n.  [place] b?t c? ho?t ??ng no gy ?au ng?c.  Khng s? d?ng b?t c? cc s?n ph?m thu?c l no, bao g?m thu?c l d?ng ht, thu?c l d?ng nhai ho?c thu?c l ?i?n t?. N?u qu v? c?n gip ?? ?? cai thu?c, [place] h?i chuyn gia ch?m [place] s?c kh?e.  Khng u?ng r??u.  Ch? s? d?ng thu?c theo ch? d?n c?a chuyn gia ch?m [place] s?c kh?e.  Tun th? t?t c? cc cu?c h?n khm l?i theo ch? d?n c?a chuyn gia ch?m [place] s?c kh?e. ?i?u ny c vai tr quan tr?ng. ?i?u ny bao g?m b?t k? ki?m tra b? sung na?o n?u c?n ?au ng??c cu?a quy? vi? khng h?t.  N?u ? nng l nguyn nhn gy ?au ng?c, quy? vi? c th? ???c yu c?u gi? cho ??u cao (nng cao) trong khi ng?. ?i?u ny lm gi?m kha? n?ng m axit s? tra?o t? d? dy ln th?c qu?n c?a quy? vi?.  Thay ??i l?i s?ng theo ch? d?n c?a chuyn gia ch?m [place] s?c kh?e. Nh??ng thay ??i c th? bao g?m: ? T?p th? d?c th??ng xuyn. H?i chuyn gia ch?m [place] s?c kh?e ?? nh?n l?i khuyn v? m?t vi ho?t ??ng an ton Reynolds Americancho qu v?. ? ?n th?c ?n t?t cho tim. M?t chuyn gia dinh d??ng ? ??ng k hnh ngh? c th? gip qu v? tm hi?u nh?ng l?a ch?n ?n u?ng c l?i cho s?c kh?e. ? Duy tr m?c cn n?ng c l?i cho s?c kh?e. ? Qu?n l b?nh ti?u ???ng, n?u c?n thi?t. ? Gi?m c?ng th?ng.  ?I KHM N?U:  ?au ng?c khng h?t sau khi ?i?u tr?[place] Rod.  Quy? vi? bi? pht ban co? mu?n gi?p trn ng?c.  Qu v? b? s?t.  NGAY L?P T?C ?I KHM N?U:  ?au ng?c n??ng h?n.  Quy? vi? bi? ho t?ng ln, ho??c quy? vi? ho ra mu.  Qu v? b? ?au b?ng r?t nhi?u.  Qu v? b? y?u  nhi?u.  Qu v? b? ng?t.  Qu v? b? ?n l?nh.  Qu v? ??t ng?t c?m th?y kh ch?u ? ng?c m khng r nguyn nhn.  Qu v? ??t ng?t c?m th?y kh ch?u ? cnh tay, l?ng, c?, ho?c hm m khng r nguyn nhn.  Qu v? b? kh th? b?t k? lc no.  Qu v? ??t ng?t ?? m? hi ho?c da qu v? tr?? nn ?m ??t.  Qu v? c?m th?y bu?n nn ho?c qu v? nn.  Qu v? ??t ng?t c?m th?y chng m?t ho?c chong vng.  Tim qu v? b?t ??u ??p nhanh, ho?c c?m th?y nh? tim ?ang b? nh?p. Nh?ng tri?u ch?ng ny c th? l m?t v?n ?? nghim tr?ng c?n c?p c?u. Khng ch? xem tri?u ch?ng c h?t khng. [place] ?i khm ngay l?p t?c. G?i cho d?ch v? c?p c?u t?i ??a ph??ng (911 ? [place] Khng t? li xe ??n b?nh vi?n. Thng tin ny khng nh?m m?c ?ch thay th? cho l?i khuyn m chuyn gia ch?m [place] s?c kh?e ni v?i qu v?. [place] b?o ??m qu v? ph?i th?o lu?n b?t k? v?n ?? g m qu v? c v?i chuyn gia ch?m [place] s?c kh?e c?a qu v?. Document Released: 05/31/2015 Document Revised: 08/16/2015 Document Reviewed: 08/16/2015 Elsevier Interactive Patient Education  2017 Elsevier Inc.      Edwina Barth, MD [facility]

## 2017-04-04 NOTE — Patient Instructions (Addendum)
   IF you received an x-ray today, you will receive an invoice from [facility]. Please contact [facility] at [phone] with questions or concerns regarding your invoice.   IF you received labwork today, you will receive an invoice from [facility]. Please contact [facility] at [phone] with questions or concerns regarding your invoice.   Our billing staff will not be able to assist you with questions regarding bills from these companies.  You will be contacted with the lab results as soon as they are available. The fastest way to get your results is to activate your My Chart account. Instructions are located on the last page of this paperwork. If you have not heard from us regarding the results in 2 weeks, please contact this office.     Nonspecific Chest Pain Chest pain can be caused by many different conditions. There is a chance that your pain could be related to something serious, such as a heart attack or a blood clot in your lungs. Chest pain can also be caused by conditions that are not life-threatening. If you have chest pain, it is very important to follow up with your doctor. Follow these instructions at home: Medicines  If you were prescribed an antibiotic medicine, take it as told by your doctor. Do not stop taking the antibiotic even if you start to feel better.  Take over-the-counter and prescription medicines only as told by your doctor. Lifestyle  Do not use any products that contain nicotine or tobacco, such as cigarettes and e-cigarettes. If you need help quitting, ask your doctor.  Do not drink alcohol.  Make lifestyle changes as told by your doctor. These may include: ? Getting regular exercise. Ask your doctor for some activities that are safe for you. ? Eating a heart-healthy diet. A diet specialist (dietitian) can help you to learn healthy eating options. ? Staying at a healthy weight. ? Managing diabetes, if needed. ? Lowering your  stress, as with deep breathing or spending time in nature. General instructions  Avoid any activities that make you feel chest pain.  If your chest pain is because of heartburn: ? Raise (elevate) the head of your bed about 6 inches (15 cm). You can do this by putting blocks under the bed legs at the head of the bed. ? Do not sleep with extra pillows under your head. That does not help heartburn.  Keep all follow-up visits as told by your doctor. This is important. This includes any further testing if your chest pain does not go away. Contact a doctor if:  Your chest pain does not go away.  You have a rash with blisters on your chest.  You have a fever.  You have chills. Get help right away if:  Your chest pain is worse.  You have a cough that gets worse, or you cough up blood.  You have very bad (severe) pain in your belly (abdomen).  You are very weak.  You pass out (faint).  You have either of these for no clear reason: ? Sudden chest discomfort. ? Sudden discomfort in your arms, back, neck, or jaw.  You have shortness of breath at any time.  You suddenly start to sweat, or your skin gets clammy.  You feel sick to your stomach (nauseous).  You throw up (vomit).  You suddenly feel light-headed or dizzy.  Your heart starts to beat fast, or it feels like it is skipping beats. These symptoms may be an emergency. Do not wait   to see if the symptoms will go away. Get medical help right away. Call your local emergency services (911 in the [place] Do not drive yourself to the hospital. This information is not intended to replace advice given to you by your health care provider. Make sure you discuss any questions you have with your health care provider. Document Released: 07/26/2007 Document Revised: 11/01/2015 Document Reviewed: 11/01/2015 Elsevier Interactive Patient Education  2017 Elsevier Inc.  ?au ng?c khng ??c hi?u (Nonspecific Chest Pain) ?au ng??c khng ???c  hi?u co? th? do nhi?u ti?nh tra?ng b?nh ly? kha?c nhau. Lun lun co? m?t kha? n?ng la? c?n ?au c?a quy? vi? c th? lin quan ??n m?t b?nh na?o ? nghim tr?ng, ch?ng h?n nh? nh?i ma?u c? tim ho?c m?t c?c mu ?ng trong ph?i c?a quy? vi?. ?au ng?c c?ng c th? do ca?c ti?nh tra?ng b?nh ly? khng ?e do?a ma?ng s?ng gy ra. N?u quy? vi? bi? ?au ng?c, m?t ?i?u r?t quan tr?ng la? quy? vi? pha?i kha?m la?i v??i chuyn gia ch?m [place] s?c kh?e c?a quy? vi?. NGUYN NHN ?au ng?c c th? ???c gy ra b?i:  ? nng.  Vim ph?i ho?c vim ph? qu?n.  Lo l?ng hay c?ng th?ng.  Vim xung quanh tim cu?a quy? vi? (vim ma?ng ngoa?i tim) ho?c ph?i (vim ma?ng ph?i ho?c vim mng ph?i).  M?t c?c mu ?ng trong ph?i c?a quy? vi?.  Ph?i xe?p (trn khi? ma?ng ph?i). N c th? t?? pht tri?n b?t ng? (tra?n khi? ma?ng ph?i t?? pha?t) ho?c do ch?n th??ng ng?c.  Nhi?m b?nh gi??i leo (vi ru?t varicella - zoster).  Nh?i mu c? tim.  T?n h?i x??ng, c? b?p v s?n t?o nn tha?nh ng?c c?a quy? vi?. T?n ha?i c th? bao g?m: ? X??ng thm ti?m do ch?n th??ng. ? C? ho?c s?n bi? ke?o c?ng do ho th??ng xuyn hay l?p ?i l?p l?i ho?c lm vi?c qu s?c. ? Gy m?t ho?c nhi?u x??ng s??n. ? S?n b? ?au do vim (vim s?n s??n). CC Y?U T? NGUY C? Ca?c y?u t? nguy c? gy ?au ng?c c th? bao g?m:  Cc ho?t ??ng lm t?ng nguy c? ch?n th??ng ho?c th??ng t?t cho ng??c cu?a quy? vi?.  Nhi?m trng h h?p ho??c ca?c b?nh ly? gy ra ho th??ng xuyn.  Ca?c ti?nh tra?ng b?nh ly? ho?c ?n qu nhi?u c th? gy ra ch?ng ? nng.  Bi? b?nh tim ho??c co? ti?n s? gia ?nh b? b?nh tim.  Ca?c b?nh ly? ho?c hnh vi s?c kh?e lm t?ng nguy c? hi?nh tha?nh c?c mu ?ng.  Bi? b?nh thu?y ??u (varicella zoster). D?U HI?U V TRI?U CH?NG ?au ng?c c th? th?y gi?ng nh?:  No?ng ho?c ng?a ran trn ng?c ho?c su trong l?ng ng?c c?a quy? vi?.  ?au nh? bi? ?e? e?p, ?au t??c, ?au nho?i ho?c ?au nh? bi? si?t ch??t.  ?au m i? ho??c  ?au nho?i n??ng h?n khi quy? vi? di chuy?n, ho ho?c th? su.  C?n ?au cu?ng co? th? c?m th?y ? l?ng, c?, vai ho?c cnh tay, ho?c c?n ?au ly lan ??n b?t k? khu v??c na?o trong s? cc khu v?c ny. C?n ?au ng?c c th? ??n v ?i, ho?c n c th? ti?p di?n lin t?c. CH?N ?ON Co? th? c?n la?m ca?c xe?t nghi?m ho?c nghin c?u khc ?? tm nguyn nhn gy ?au. Chuyn gia ch?m [place] s?c kh?e c th? cho qu v? lm m?t ki?m tra g?i l ?i?n tm ??  l?u ??ng ECG (?i?n tm ??). ECG ghi la?i nh?p tim c?a quy? vi? t?i th?i ?i?m th??c hi?n ki?m tra. Qu v? c?ng c th? ph?i lm cc ki?m tra khc, ch?ng h?n nh?:  Siu m tim qua thnh ng?c (TTE). Trong siu m qua tha?nh ng??c, sng m thanh ???c s? d?ng ?? t?o ra hnh ?nh c?a t?t c? cc c?u trc tim v xem do?ng mu ch?y qua tim nh? th? na?o.  Siu m tim qua th??c qua?n (TEE).?y l m?t ki?m tra hnh ?nh nng cao thu ca?c hnh ?nh t? bn trong c? th? c?a quy? vi?. N cho php chuyn gia ch?m [place] s??c kho?e nhi?n tim c?a quy? vi? chi ti?t h?n.  Theo di tim. Bi?n php ny cho php chuyn gia ch?m [place] s?c kh?e theo di nh?p v nh?p ?i?u c?a tim theo th?i gian th?c.  My ?i?n tim ([place]). ?y l m?t thi?t b? c?m tay ghi l?i nh?p tim cu?a quy? vi? v c th? gip ch?n ?on r?i lo?n nh?p tim. N cho php chuyn gia ch?m [place] s?c kh?e theo di ho?t ??ng tim c?a qu v? trong vi ngy, n?u c?n.  Nghi?m php g?ng s?c. Nh??ng ki?m tra na?y c th? ???c th?c hi?n thng qua t?p th? d?c ho?c cho dng thu?c lm cho tim quy? vi? ??p nhanh h?n.  Xt nghi?m mu.  Cc ki?m tra b??ng hnh ?nh. ?I?U TR? ?i?u tr? ph? thu?c [place] y?u t? gy ra c?n ?au ng?c c?a quy? vi?. ?i?u tr? c th? bao g?m:  Thu?c. Thu?c c th? bao g?m: ? Thu?c ch??n a xi?t ?? ch?ng ch?ng ? nng. ? Thu?c ch?ng vim. ? Thu?c gia?m ?au cho ca?c b?nh vim. ? Thu?c khng sinh n?u c nhi?m trng. ? Thu?c la?m tan c?c mu ?ng. ? Thu?c ?? ?i?u tr? b?nh ??ng m?ch vnh.  Ch?m [place] h? tr? cho cc  b?nh ly? khng c?n thu?c. Vi?c ny c th? bao g?m: ? Ngh? ng?i. ? Ch???m no?ng ho?c l?nh va?o ca?c khu v??c bi? t?n th??ng. ? H?n ch? ho?t ??ng cho ??n khi c?n ?au gi?m. H??NG D?N CH?M [place] T?I NH  N?u qu v? ???c k ??n dng thu?c khng sinh, Sidharth dng h?t thu?c ngay c? khi qu v? b?t ??u c?m th?y ?? h?n.  [place] b?t c? ho?t ??ng no gy ?au ng?c.  Khng s? d?ng b?t c? cc s?n ph?m thu?c l no, bao g?m thu?c l d?ng ht, thu?c l d?ng nhai ho?c thu?c l ?i?n t?. N?u qu v? c?n gip ?? ?? cai thu?c, [place] h?i chuyn gia ch?m [place] s?c kh?e.  Khng u?ng r??u.  Ch? s? d?ng thu?c theo ch? d?n c?a chuyn gia ch?m [place] s?c kh?e.  Tun th? t?t c? cc cu?c h?n khm l?i theo ch? d?n c?a chuyn gia ch?m [place] s?c kh?e. ?i?u ny c vai tr quan tr?ng. ?i?u ny bao g?m b?t k? ki?m tra b? sung na?o n?u c?n ?au ng??c cu?a quy? vi? khng h?t.  N?u ? nng l nguyn nhn gy ?au ng?c, quy? vi? c th? ???c yu c?u gi? cho ??u cao (nng cao) trong khi ng?. ?i?u ny lm gi?m kha? n?ng m axit s? tra?o t? d? dy ln th?c qu?n c?a quy? vi?.  Thay ??i l?i s?ng theo ch? d?n c?a chuyn gia ch?m [place] s?c kh?e. Nh??ng thay ??i c th? bao g?m: ? T?p th? d?c th??ng xuyn. H?i chuyn gia ch?m [place] s?c kh?e ?? nh?n l?i khuyn v?  m?t vi ho?t ??ng an ton cho qu v?. ? ?n th?c ?n t?t cho tim. M?t chuyn gia dinh d??ng ? ??ng k hnh ngh? c th? gip qu v? tm hi?u nh?ng l?a ch?n ?n u?ng c l?i cho s?c kh?e. ? Duy tr m?c cn n?ng c l?i cho s?c kh?e. ? Qu?n l b?nh ti?u ???ng, n?u c?n thi?t. ? Gi?m c?ng th?ng.  ?I KHM N?U:  ?au ng?c khng h?t sau khi ?i?u tr?Anselmo Rod? vi? bi? pht ban co? mu?n gi?p trn ng?c.  Qu v? b? s?t.  NGAY L?P T?C ?I KHM N?U:  ?au ng?c n??ng h?n.  Quy? vi? bi? ho t?ng ln, ho??c quy? vi? ho ra mu.  Qu v? b? ?au b?ng r?t nhi?u.  Qu v? b? y?u nhi?u.  Qu v? b? ng?t.  Qu v? b? ?n l?nh.  Qu v? ??t ng?t c?m th?y kh ch?u ? ng?c m khng r nguyn nhn.  Qu v? ??t ng?t  c?m th?y kh ch?u ? cnh tay, l?ng, c?, ho?c hm m khng r nguyn nhn.  Qu v? b? kh th? b?t k? lc no.  Qu v? ??t ng?t ?? m? hi ho?c da qu v? tr?? nn ?m ??t.  Qu v? c?m th?y bu?n nn ho?c qu v? nn.  Qu v? ??t ng?t c?m th?y chng m?t ho?c chong vng.  Tim qu v? b?t ??u ??p nhanh, ho?c c?m th?y nh? tim ?ang b? nh?p. Nh?ng tri?u ch?ng ny c th? l m?t v?n ?? nghim tr?ng c?n c?p c?u. Khng ch? xem tri?u ch?ng c h?t khng. Vinicio ?i khm ngay l?p t?c. G?i cho d?ch v? c?p c?u t?i ??a ph??ng (911 ? [place] Khng t? li xe ??n b?nh vi?n. Thng tin ny khng nh?m m?c ?ch thay th? cho l?i khuyn m chuyn gia ch?m [place] s?c kh?e ni v?i qu v?. Charly b?o ??m qu v? ph?i th?o lu?n b?t k? v?n ?? g m qu v? c v?i chuyn gia ch?m [place] s?c kh?e c?a qu v?. Document Released: 05/31/2015 Document Revised: 08/16/2015 Document Reviewed: 08/16/2015 Elsevier Interactive Patient Education  2017 [employer].

## 2017-04-05 LAB — CBC WITH DIFFERENTIAL/PLATELET
BASOS ABS: 0 10*3/uL (ref 0.0–0.2)
BASOS: 0 %
EOS (ABSOLUTE): 0.4 10*3/uL (ref 0.0–0.4)
Eos: 6 %
HEMOGLOBIN: 13.3 g/dL (ref 13.0–17.7)
Hematocrit: 39 % (ref 37.5–51.0)
IMMATURE GRANS (ABS): 0 10*3/uL (ref 0.0–0.1)
Immature Granulocytes: 0 %
LYMPHS ABS: 1.6 10*3/uL (ref 0.7–3.1)
LYMPHS: 22 %
MCH: 32.3 pg (ref 26.6–33.0)
MCHC: 34.1 g/dL (ref 31.5–35.7)
MCV: 95 fL (ref 79–97)
MONOCYTES: 8 %
Monocytes Absolute: 0.6 10*3/uL (ref 0.1–0.9)
NEUTROS ABS: 4.7 10*3/uL (ref 1.4–7.0)
Neutrophils: 64 %
Platelets: 263 10*3/uL (ref 150–379)
RBC: 4.12 x10E6/uL — ABNORMAL LOW (ref 4.14–5.80)
RDW: 13.5 % (ref 12.3–15.4)
WBC: 7.3 10*3/uL (ref 3.4–10.8)

## 2017-04-05 LAB — LIPID PANEL
CHOL/HDL RATIO: 2.5 ratio (ref 0.0–5.0)
Cholesterol, Total: 194 mg/dL (ref 100–199)
HDL: 77 mg/dL (ref >39)
LDL Calculated: 100 mg/dL — ABNORMAL HIGH (ref 0–99)
TRIGLYCERIDES: 86 mg/dL (ref 0–149)
VLDL CHOLESTEROL CAL: 17 mg/dL (ref 5–40)

## 2017-04-05 LAB — COMPREHENSIVE METABOLIC PANEL
ALT: 12 IU/L (ref 0–44)
AST: 27 IU/L (ref 0–40)
Albumin/Globulin Ratio: 1.6 (ref 1.2–2.2)
Albumin: 4.5 g/dL (ref 3.5–5.5)
Alkaline Phosphatase: 113 IU/L (ref 39–117)
BUN/Creatinine Ratio: 15 (ref 9–20)
BUN: 12 mg/dL (ref 6–24)
Bilirubin Total: 0.5 mg/dL (ref 0.0–1.2)
CALCIUM: 9.5 mg/dL (ref 8.7–10.2)
CHLORIDE: 106 mmol/L (ref 96–106)
CO2: 22 mmol/L (ref 20–29)
Creatinine, Ser: 0.8 mg/dL (ref 0.76–1.27)
GFR, EST AFRICAN AMERICAN: 113 mL/min/{1.73_m2} (ref >59)
GFR, EST NON AFRICAN AMERICAN: 98 mL/min/{1.73_m2} (ref >59)
GLUCOSE: 96 mg/dL (ref 65–99)
Globulin, Total: 2.8 g/dL (ref 1.5–4.5)
POTASSIUM: 4.6 mmol/L (ref 3.5–5.2)
Sodium: 144 mmol/L (ref 134–144)
TOTAL PROTEIN: 7.3 g/dL (ref 6.0–8.5)

## 2017-07-25 ENCOUNTER — Ambulatory Visit: Payer: BLUE CROSS/BLUE SHIELD | Admitting: Physician Assistant

## 2017-07-28 ENCOUNTER — Emergency Department (HOSPITAL_COMMUNITY)
Admission: EM | Admit: 2017-07-28 | Discharge: 2017-07-29 | Disposition: A | Discharge status: Home / Self Care | Payer: BLUE CROSS/BLUE SHIELD | Attending: Emergency Medicine | Admitting: Emergency Medicine

## 2017-07-28 ENCOUNTER — Encounter (HOSPITAL_COMMUNITY): Payer: Self-pay | Admitting: Emergency Medicine

## 2017-07-28 DIAGNOSIS — F17210 Nicotine dependence, cigarettes, uncomplicated: Secondary | ICD-10-CM | POA: Insufficient documentation

## 2017-07-28 DIAGNOSIS — R319 Hematuria, unspecified: Secondary | ICD-10-CM | POA: Diagnosis not present

## 2017-07-28 DIAGNOSIS — N201 Calculus of ureter: Secondary | ICD-10-CM | POA: Diagnosis not present

## 2017-07-28 DIAGNOSIS — R109 Unspecified abdominal pain: Secondary | ICD-10-CM | POA: Diagnosis present

## 2017-07-28 DIAGNOSIS — Z79899 Other long term (current) drug therapy: Secondary | ICD-10-CM | POA: Diagnosis not present

## 2017-07-28 LAB — URINALYSIS, ROUTINE W REFLEX MICROSCOPIC
Bilirubin Urine: NEGATIVE
GLUCOSE, UA: 50 mg/dL — AB
KETONES UR: NEGATIVE mg/dL
LEUKOCYTES UA: NEGATIVE
Nitrite: NEGATIVE
PH: 6 (ref 5.0–8.0)
Protein, ur: NEGATIVE mg/dL
Specific Gravity, Urine: 1.009 (ref 1.005–1.030)

## 2017-07-28 NOTE — ED Triage Notes (Signed)
Reports left flank pain for a week with intermittent bloody urine.  Also endorses frequency and urgency.

## 2017-07-29 ENCOUNTER — Emergency Department (HOSPITAL_COMMUNITY): Payer: BLUE CROSS/BLUE SHIELD

## 2017-07-29 MED ORDER — TAMSULOSIN HCL 0.4 MG PO CAPS: 0.4000 mg | ORAL_CAPSULE | Freq: Every day | ORAL | 0 refills | Status: DC

## 2017-07-29 MED ORDER — HYDROCODONE-ACETAMINOPHEN 5-325 MG PO TABS: 1.0000 | ORAL_TABLET | Freq: Four times a day (QID) | ORAL | 0 refills | Status: DC | PRN

## 2017-07-29 NOTE — Discharge Instructions (Signed)
Follow up with Urology for further evaluation of your symptoms and to ensure that you pass your stones. Return for worsening pain or other concerning symptoms.

## 2017-07-29 NOTE — ED Provider Notes (Signed)
[facility] Provider Note   CSN: [id number] Arrival date & time: 07/28/17  2210     History   Chief Complaint Chief Complaint  Patient presents with  . Flank Pain    HPI Derek Lucero is a 62 y.o. male.   62 year old male presents to the emergency department for evaluation of hematuria x4 days.  This has been intermittent and recurred today.  Patient with urinary frequency and urgency in addition to flank pain.  Flank pain noted to be left-sided and radiates towards the left lower abdomen.  Flank pain was rated at 9/10 a few days ago.  It subsided spontaneously and recurred earlier today.  Patient rates pain at 4/10 prior to arrival.  No medications taken for symptoms.  No dysuria, fevers, vomiting, history of abdominal surgeries.  No prior history of kidney stones.     History reviewed. No pertinent past medical history.  Patient Active Problem List   Diagnosis Date Noted  . Chest pain 04/04/2017  . Upper back pain on left side 04/04/2017  . Acute pain of left shoulder 04/04/2017  . Musculoskeletal pain 04/04/2017  . Cough 02/17/2017  . Acute bronchitis 02/17/2017    History reviewed. No pertinent surgical history.      Home Medications    Prior to Admission medications   Medication Sig Start Date End Date Taking? Authorizing Provider  azithromycin (ZITHROMAX) 250 MG tablet Sig as indicated Patient not taking: Reported on 04/04/2017 02/17/17   Georgina QuintSagardia, Miguel Jose, MD  benzonatate (TESSALON) 200 MG capsule Take 1 capsule (200 mg total) by mouth 2 (two) times daily as needed for cough. Patient not taking: Reported on 04/04/2017 02/17/17   Georgina QuintSagardia, Miguel Jose, MD  dextromethorphan [facility](DELSYM) 30 MG/5ML liquid Take 30 mg by mouth as needed for cough.    [provider]  glucosamine-chondroitin 500-400 MG tablet Take 1 tablet by mouth 3 (three) times daily.    [provider]  HYDROcodone-acetaminophen (NORCO/VICODIN)  5-325 MG tablet Take 1-2 tablets by mouth every 6 (six) hours as needed for severe pain. 07/29/17   Antony MaduraHumes, Cela Newcom, PA-C  IBUPROFEN PO Take by mouth daily.    [provider]  tamsulosin (FLOMAX) 0.4 MG CAPS capsule Take 1 capsule (0.4 mg total) by mouth daily. 07/29/17   Antony MaduraHumes, Jedrek Dinovo, PA-C  traMADol (ULTRAM) 50 MG tablet Take 1 tablet (50 mg total) by mouth every 8 (eight) hours as needed. Patient not taking: Reported on 02/17/2017 11/18/15   Tobey GrimWalden, Jeffrey H, MD  valACYclovir (VALTREX) 1000 MG tablet Take 1 tablet (1,000 mg total) by mouth 3 (three) times daily. Patient not taking: Reported on 02/17/2017 11/18/15   Tobey GrimWalden, Jeffrey H, MD    Family History No family history on file.  Social History Social History   Tobacco Use  . Smoking status: Current Some Day Smoker    Types: Cigarettes  . Smokeless tobacco: Never Used  Substance Use Topics  . Alcohol use: Yes    Comment: occasionally  . Drug use: Never     Allergies   Patient has no known allergies.   Review of Systems Review of Systems Ten systems reviewed and are negative for acute change, except as noted in the HPI.    Physical Exam Updated Vital Signs BP (!) 134/92 (BP Location: Right Arm)   Pulse 62   Temp 97.9 F (36.6 C) (Oral)   Resp 16   Ht 5' 3.5" (1.613 m)   Wt 59.4 kg (131 lb)  SpO2 100%   BMI 22.84 kg/m   Physical Exam  Constitutional: He is oriented to person, place, and time. He appears well-developed and well-nourished. No distress.  Nontoxic appearing and in no distress  HENT:  Head: Normocephalic and atraumatic.  Eyes: Conjunctivae and EOM are normal. No scleral icterus.  Neck: Normal range of motion.  Cardiovascular: Normal rate, regular rhythm and intact distal pulses.  Pulmonary/Chest: Effort normal. No stridor. No respiratory distress.  Respirations even and unlabored  Abdominal: Soft. He exhibits no mass. There is tenderness (minimal TTP in the L mid abdomen). There is no  guarding.  Soft abdomen.  No guarding or peritoneal signs.  No palpable masses.  Musculoskeletal: Normal range of motion.  Neurological: He is alert and oriented to person, place, and time. He exhibits normal muscle tone. Coordination normal.  GCS 15.  Speech is goal oriented.  Skin: Skin is warm and dry. No rash noted. He is not diaphoretic. No erythema. No pallor.  Psychiatric: He has a normal mood and affect. His behavior is normal.  Nursing note and vitals reviewed.    ED Treatments / Results  Labs (all labs ordered are listed, but only abnormal results are displayed) Labs Reviewed  URINALYSIS, ROUTINE W REFLEX MICROSCOPIC - Abnormal; Notable for the following components:      Result Value   Glucose, UA 50 (*)    Hgb urine dipstick LARGE (*)    Bacteria, UA RARE (*)    All other components within normal limits    EKG None  Radiology Ct Renal Stone Study  Result Date: 07/29/2017 CLINICAL DATA:  Acute onset of left flank pain and hematuria. Increased urinary frequency and urgency. EXAM: CT ABDOMEN AND PELVIS WITHOUT CONTRAST TECHNIQUE: Multidetector CT imaging of the abdomen and pelvis was performed following the standard protocol without IV contrast. COMPARISON:  None. FINDINGS: Lower chest: The visualized lung bases are grossly clear. The visualized portions of the mediastinum are unremarkable. Hepatobiliary: The liver is unremarkable in appearance. The gallbladder is unremarkable in appearance. The common bile duct remains normal in caliber. Pancreas: The pancreas is within normal limits. Spleen: The spleen is unremarkable in appearance. Adrenals/Urinary Tract: The adrenal glands are unremarkable in appearance. Mild left-sided hydronephrosis is noted. This reflects 2 obstructing stones distally at the left vesicoureteral junction, measuring 5 x 4 mm and 4 mm in size. No significant perinephric stranding is seen. No nonobstructing renal stones are identified. Stomach/Bowel: The  stomach is unremarkable in appearance. The small bowel is within normal limits. The appendix is normal in caliber, without evidence of appendicitis. The colon is unremarkable in appearance. Vascular/Lymphatic: Scattered calcification is seen along the abdominal aorta and its branches. The abdominal aorta is otherwise grossly unremarkable. The inferior vena cava is grossly unremarkable. No retroperitoneal lymphadenopathy is seen. No pelvic sidewall lymphadenopathy is identified. Reproductive: The bladder is mildly distended and otherwise unremarkable. The prostate remains normal in size, with minimal calcification. Other: No additional soft tissue abnormalities are seen. Musculoskeletal: No acute osseous abnormalities are identified. There is a chronic fracture of an anterolateral osteophyte at the superior endplate of L5. Mild degenerative change is noted along the lumbar spine. The visualized musculature is unremarkable in appearance. IMPRESSION: Mild left-sided hydronephrosis noted. This reflects 2 obstructing stones distally at the left vesicoureteral junction, measuring 5 x 4 mm and 4 mm in size. Aortic Atherosclerosis (ICD10-I70.0). Electronically Signed   By: Roanna Raider M.D.   On: 07/29/2017 02:58    Procedures Procedures (including critical  care time)  Medications Ordered in ED Medications - No data to display   Initial Impression / Assessment and Plan / ED Course  I have reviewed the triage vital signs and the nursing notes.  Pertinent labs & imaging results that were available during my care of the patient were reviewed by me and considered in my medical decision making (see chart for details).     Patient presents to the ED for intermittent hematuria and flank pain. He has been diagnosed with kidney stones via CT. There is no evidence of significant hydronephrosis, vitals sign stable and the pt does not have irratractable vomiting. He declines pain medication in the ED today. Pt will  be dc home with pain medications & has been advised to follow up with Urology. Return precautions discussed and provided. Patient discharged in stable condition with no unaddressed concerns.   Final Clinical Impressions(s) / ED Diagnoses   Final diagnoses:  Left flank pain  Hematuria, unspecified type  Ureterolithiasis    ED Discharge Orders        Ordered    tamsulosin (FLOMAX) 0.4 MG CAPS capsule  Daily     07/29/17 0326    HYDROcodone-acetaminophen (NORCO/VICODIN) 5-325 MG tablet  Every 6 hours PRN     07/29/17 0326       Antony Madura, PA-C 07/29/17 [id number]    Wilkie Aye, Mayer Masker, MD 07/29/17 [phone]

## 2017-07-31 ENCOUNTER — Ambulatory Visit: Payer: BLUE CROSS/BLUE SHIELD | Admitting: Emergency Medicine

## 2017-07-31 ENCOUNTER — Ambulatory Visit (INDEPENDENT_AMBULATORY_CARE_PROVIDER_SITE_OTHER): Payer: BLUE CROSS/BLUE SHIELD

## 2017-07-31 ENCOUNTER — Other Ambulatory Visit: Payer: Self-pay

## 2017-07-31 ENCOUNTER — Encounter: Payer: Self-pay | Admitting: Emergency Medicine

## 2017-07-31 VITALS — BP 100/72 | HR 92 | Temp 98.4°F | Resp 16 | Ht 64.0 in | Wt 125.0 lb

## 2017-07-31 DIAGNOSIS — M25511 Pain in right shoulder: Secondary | ICD-10-CM | POA: Diagnosis not present

## 2017-07-31 DIAGNOSIS — M19011 Primary osteoarthritis, right shoulder: Secondary | ICD-10-CM

## 2017-07-31 MED ORDER — DICLOFENAC SODIUM 75 MG PO TBEC: 75.0000 mg | DELAYED_RELEASE_TABLET | Freq: Two times a day (BID) | ORAL | 0 refills | Status: AC

## 2017-07-31 NOTE — Progress Notes (Signed)
Derek Lucero 62 y.o.   Chief Complaint  Patient presents with  . Shoulder Pain    RIGHT started 07/30/17 at night    HISTORY OF PRESENT ILLNESS: This is a 62 y.o. male complaining of pain to the right shoulder since last night.  Was lifting heavy bricks earlier in the day.  Pain is sharp, constant, worse with movement, and radiates down the right arm.  Better with rest.  No associated symptoms.  HPI   Prior to Admission medications   Medication Sig Start Date End Date Taking? Authorizing Provider  HYDROcodone-acetaminophen (NORCO/VICODIN) 5-325 MG tablet Take 1-2 tablets by mouth every 6 (six) hours as needed for severe pain. 07/29/17  Yes Antony Madura, PA-C  tamsulosin (FLOMAX) 0.4 MG CAPS capsule Take 1 capsule (0.4 mg total) by mouth daily. 07/29/17  Yes Antony Madura, PA-C  glucosamine-chondroitin 500-400 MG tablet Take 1 tablet by mouth 3 (three) times daily.    [provider]  IBUPROFEN PO Take by mouth daily.    [provider]  traMADol (ULTRAM) 50 MG tablet Take 1 tablet (50 mg total) by mouth every 8 (eight) hours as needed. Patient not taking: Reported on 02/17/2017 11/18/15   Tobey Grim, MD  valACYclovir (VALTREX) 1000 MG tablet Take 1 tablet (1,000 mg total) by mouth 3 (three) times daily. Patient not taking: Reported on 02/17/2017 11/18/15   Tobey Grim, MD    No Known Allergies  Patient Active Problem List   Diagnosis Date Noted  . Chest pain 04/04/2017  . Upper back pain on left side 04/04/2017  . Acute pain of left shoulder 04/04/2017  . Musculoskeletal pain 04/04/2017  . Cough 02/17/2017  . Acute bronchitis 02/17/2017    No past medical history on file.  No past surgical history on file.  Social History   Socioeconomic History  . Marital status: Married    Spouse name: Not on file  . Number of children: Not on file  . Years of education: Not on file  . Highest education level: Not on file  Occupational History  . Not on file    Social Needs  . Financial resource strain: Not on file  . Food insecurity:    Worry: Not on file    Inability: Not on file  . Transportation needs:    Medical: Not on file    Non-medical: Not on file  Tobacco Use  . Smoking status: Current Some Day Smoker    Types: Cigarettes  . Smokeless tobacco: Never Used  Substance and Sexual Activity  . Alcohol use: Yes    Comment: occasionally  . Drug use: Never  . Sexual activity: Not on file  Lifestyle  . Physical activity:    Days per week: Not on file    Minutes per session: Not on file  . Stress: Not on file  Relationships  . Social connections:    Talks on phone: Not on file    Gets together: Not on file    Attends religious service: Not on file    Active member of club or organization: Not on file    Attends meetings of clubs or organizations: Not on file    Relationship status: Not on file  . Intimate partner violence:    Fear of current or ex partner: Not on file    Emotionally abused: Not on file    Physically abused: Not on file    Forced sexual activity: Not on file  Other Topics Concern  .  Not on file  Social History Narrative  . Not on file    No family history on file.   ROS  Vitals:   07/31/17 1427  BP: 100/72  Pulse: 92  Resp: 16  Temp: 98.4 F (36.9 C)  SpO2: 98%    Physical Exam  Constitutional: He is oriented to person, place, and time. He appears well-developed and well-nourished.  HENT:  Head: Normocephalic and atraumatic.  Eyes: Pupils are equal, round, and reactive to light. EOM are normal.  Neck: Normal range of motion. Neck supple.  Cardiovascular: Normal rate and regular rhythm.  Pulmonary/Chest: Effort normal and breath sounds normal.  Musculoskeletal:  Right shoulder: Some tenderness.  No bruising or erythema.  No swelling.  Limited range of motion. Right upper extremity: Elbow and wrist within normal limits.  NVI.  Neurological: He is alert and oriented to person, place, and  time. No sensory deficit. He exhibits normal muscle tone.  Skin: Skin is warm and dry. Capillary refill takes less than 2 seconds.  Psychiatric: He has a normal mood and affect. His behavior is normal.  Vitals reviewed.  Dg Shoulder Right  Result Date: 07/31/2017 CLINICAL DATA:  RIGHT shoulder pain, acute EXAM: RIGHT SHOULDER - 2+ VIEW COMPARISON:  None FINDINGS: Degenerative changes RIGHT AC joint with minimal spurring and a small calcified ossicle at the joint space. Small inferior acromial spurs. Low normal osseous mineralization. No glenohumeral fracture, dislocation, or bone destruction. Visualized RIGHT ribs intact. IMPRESSION: RIGHT AC joint degenerative changes. No acute abnormalities. Electronically Signed   By: Ulyses SouthwardMark  Boles M.D.   On: 07/31/2017 15:13   A total of 25 minutes was spent in the room with the patient, greater than 50% of which was in counseling/coordination of care regarding differential diagnosis, treatment, medications, need for follow-up if no better or worse.   ASSESSMENT & PLAN: Derek Lucero was seen today for shoulder pain.  Diagnoses and all orders for this visit:  Acute pain of right shoulder -     DG Shoulder Right; Future -     diclofenac (VOLTAREN) 75 MG EC tablet; Take 1 tablet (75 mg total) by mouth 2 (two) times daily for 5 days. After 5 days take as needed.  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 Please contact [facility] at [phone] with questions or concerns regarding your invoice.   IF you received labwork today, you will receive an invoice from [facility]. Please contact [facility] at [phone] with questions or concerns regarding your invoice.   Our billing staff will not be able to assist you with questions regarding bills from these companies.  You will be contacted with the lab results as soon as  they are available. The fastest way to get your results is to activate your My Chart account. Instructions are located on the last page of this paperwork. If you have not heard from us regarding the results in 2 weeks, please contact this office.     ?au vai Shoulder Pain Nhi?u nguyn nhn c th? gy ?au vai, bao g?m:  Ch?n th??ng ? khu v?c vai.  V?n ??ng vai qu m?c.  Vim kh?p.  Ngu?n gy ?au c th? l:  Vim.  Ch?n th??ng kh?p vai.  Ch?n th??ng gn, dy ch?ng ho?c x??ng.  Tun th? nh?ng h??ng d?n ny ? nh: Th?c hi?n nh?ng hnh ??ng sau ??  gip gi?m ?au:  Bp qu? bng m?m ho?c t?m b?t bi?n cng nhi?u cng t?t. Vi?c ny gip gi? cho vai khng b? s?ng. N c?ng gip t?ng c??ng s?c m?nh c?a cnh tay.  Ch? s? d?ng thu?c khng k ??n v thu?c k ??n theo ch? d?n c?a chuyn gia ch?m [place] s?c kh?e.  N?u ???c ch? d?n, [place] ch??m ? l?nh [place] vng b? ?au: ? Cho ? l?nh [place] ti ni lng. ? ?? kh?n t?m ? gi?a da v ti ch??m. ? Ch??m ? l?nh trong 20 pht, 2-3 l?n m?i ngy. D?ng ch??m ? l?nh n?u vi?c ? khng lm gi?m ?au.  N?u quy? vi? ???c cho du?ng ?ai ?eo vai ho?c b?ng c? ??nh kh?p vai: ? ?eo ?ai ho?c b?ng ny theo chi? d?n. ? Tho ?ai ho?c b?ng ra ?? t?m. ? C? ??ng cnh tay cng t cng t?t, nh?ng lun c? ??ng bn tay c?a qu v? ?? trnh s?ng n?.  Trelon lin l?c v?i chuyn gia ch?m [place] s?c kh?e n?u:  ?au tr? nn tr?m tr?ng h?n.  Khng h?t ?au sau khi dng thu?c.  ?au m?i b? ? cnh tay, bn tay ho?c cc ngn tay c?a qu v?. Yu c?u tr? gip ngay l?p t?c n?u:  Cnh tay, bn tay ho?c ngn tay c?a qu v?: ? ?au bu?t. ? B? t. ? B? s?ng. ? B? ?au. ? Chuy?n sang mu tr?ng ho?c xanh. Thng tin ny khng nh?m m?c ?ch thay th? cho l?i khuyn m chuyn gia ch?m [place] s?c kh?e ni v?i qu v?. Kobey b?o ??m qu v? ph?i th?o lu?n b?t k? v?n ?? g m qu v? c v?i chuyn gia ch?m [place] s?c kh?e c?a qu v?. Document Released: 08/08/2011 Document Revised: 05/25/2016 Document Reviewed:  06/01/2014 Elsevier Interactive Patient Education  2018 Elsevier Inc.      Edwina Barth, MD [facility]

## 2017-07-31 NOTE — Patient Instructions (Addendum)
     IF you received an x-ray today, you will receive an invoice from [facility]. Please contact [facility] at [phone] with questions or concerns regarding your invoice.   IF you received labwork today, you will receive an invoice from [facility]. Please contact [facility] at [phone] with questions or concerns regarding your invoice.   Our billing staff will not be able to assist you with questions regarding bills from these companies.  You will be contacted with the lab results as soon as they are available. The fastest way to get your results is to activate your My Chart account. Instructions are located on the last page of this paperwork. If you have not heard from us regarding the results in 2 weeks, please contact this office.     ?au vai Shoulder Pain Nhi?u nguyn nhn c th? gy ?au vai, bao g?m:  Ch?n th??ng ? khu v?c vai.  V?n ??ng vai qu m?c.  Vim kh?p.  Ngu?n gy ?au c th? l:  Vim.  Ch?n th??ng kh?p vai.  Ch?n th??ng gn, dy ch?ng ho?c x??ng.  Tun th? nh?ng h??ng d?n ny ? nh: Th?c hi?n nh?ng hnh ??ng sau ?? gip gi?m ?au:  Bp qu? bng m?m ho?c t?m b?t bi?n cng nhi?u cng t?t. Vi?c ny gip gi? cho vai khng b? s?ng. N c?ng gip t?ng c??ng s?c m?nh c?a cnh tay.  Ch? s? d?ng thu?c khng k ??n v thu?c k ??n theo ch? d?n c?a chuyn gia ch?m [place] s?c kh?e.  N?u ???c ch? d?n, Eathan ch??m ? l?nh [place] vng b? ?au: ? Cho ? l?nh Ammar ti ni lng. ? ?? kh?n t?m ? gi?a da v ti ch??m. ? Ch??m ? l?nh trong 20 pht, 2-3 l?n m?i ngy. D?ng ch??m ? l?nh n?u vi?c ? khng lm gi?m ?au.  N?u quy? vi? ???c cho du?ng ?ai ?eo vai ho?c b?ng c? ??nh kh?p vai: ? ?eo ?ai ho?c b?ng ny theo chi? d?n. ? Tho ?ai ho?c b?ng ra ?? t?m. ? C? ??ng cnh tay cng t cng t?t, nh?ng lun c? ??ng bn tay c?a qu v? ?? trnh s?ng n?.  Keylor lin l?c v?i chuyn gia ch?m [place] s?c kh?e n?u:  ?au tr? nn tr?m tr?ng h?n.  Khng h?t ?au sau khi dng  thu?c.  ?au m?i b? ? cnh tay, bn tay ho?c cc ngn tay c?a qu v?. Yu c?u tr? gip ngay l?p t?c n?u:  Cnh tay, bn tay ho?c ngn tay c?a qu v?: ? ?au bu?t. ? B? t. ? B? s?ng. ? B? ?au. ? Chuy?n sang mu tr?ng ho?c xanh. Thng tin ny khng nh?m m?c ?ch thay th? cho l?i khuyn m chuyn gia ch?m [place] s?c kh?e ni v?i qu v?. Kurt b?o ??m qu v? ph?i th?o lu?n b?t k? v?n ?? g m qu v? c v?i chuyn gia ch?m [place] s?c kh?e c?a qu v?. Document Released: 08/08/2011 Document Revised: 05/25/2016 Document Reviewed: 06/01/2014 Elsevier Interactive Patient Education  2018 [employer]

## 2019-04-24 ENCOUNTER — Ambulatory Visit: Payer: Self-pay

## 2019-04-28 ENCOUNTER — Ambulatory Visit: Payer: Self-pay | Attending: Internal Medicine

## 2019-04-28 DIAGNOSIS — Z23 Encounter for immunization: Secondary | ICD-10-CM | POA: Insufficient documentation

## 2019-04-28 NOTE — Progress Notes (Signed)
   Covid-19 Vaccination Clinic  Name:  Derek Lucero    MRN: [id number] DOB: 09-02-1955  04/28/2019  Derek Lucero was observed post Covid-19 immunization for 15 minutes without incident. He was provided with Vaccine Information Sheet and instruction to access the V-Safe system.   Derek Lucero was instructed to call 911 with any severe reactions post vaccine: Marland Kitchen Difficulty breathing  . Swelling of face and throat  . A fast heartbeat  . A bad rash all over body  . Dizziness and weakness   Immunizations Administered    Name Date Dose VIS Date Route   Pfizer COVID-19 Vaccine 04/28/2019 10:15 AM 0.3 mL 01/31/2019 Intramuscular   Manufacturer: [employer], [employer]   Lot: [id number]   NDC: [address]-2

## 2019-06-02 ENCOUNTER — Ambulatory Visit: Payer: Self-pay

## 2019-06-03 ENCOUNTER — Ambulatory Visit: Payer: Self-pay | Attending: Internal Medicine

## 2019-06-03 DIAGNOSIS — Z23 Encounter for immunization: Secondary | ICD-10-CM

## 2019-06-03 NOTE — Progress Notes (Signed)
   Covid-19 Vaccination Clinic  Name:  Doug Bucklin    MRN: [id number] DOB: 11/25/1955  06/03/2019  Mr. Kraeger was observed post Covid-19 immunization for 15 minutes without incident. He was provided with Vaccine Information Sheet and instruction to access the V-Safe system.   Mr. Tooker was instructed to call 911 with any severe reactions post vaccine: Marland Kitchen Difficulty breathing  . Swelling of face and throat  . A fast heartbeat  . A bad rash all over body  . Dizziness and weakness   Immunizations Administered    Name Date Dose VIS Date Route   Pfizer COVID-19 Vaccine 06/03/2019  9:07 AM 0.3 mL 01/31/2019 Intramuscular   Manufacturer: [employer], [employer]   Lot: [id number]   NDC: [address]-3

## 2019-06-09 IMAGING — CT CT RENAL STONE PROTOCOL
2 of 4 series · 16 of 46 positions shown, 18 images · non-contrast
Comparison: None.

CLINICAL DATA: Acute onset of left flank pain and hematuria.
Increased urinary frequency and urgency.

EXAM:
CT ABDOMEN AND PELVIS WITHOUT CONTRAST
TECHNIQUE: Multidetector CT imaging of the abdomen and pelvis was performed
following the standard protocol without IV contrast.

[Series 3: ap without · axial · non-contrast · 0.69mm/px · z∈[+1087,+1482]mm · 13 of 89 slices shown, 15 images]
[im 5/89  soft-tissue]
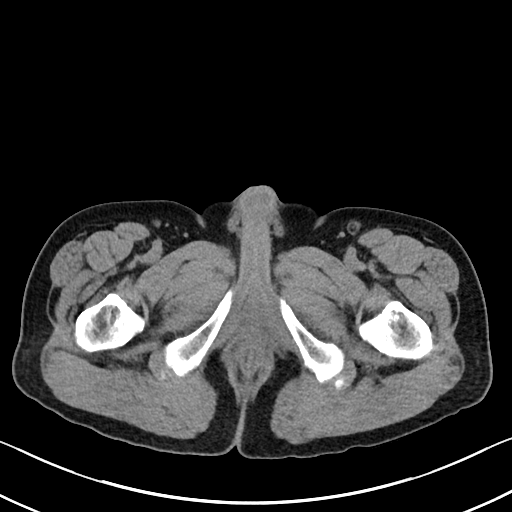
[im 5/89  bone]
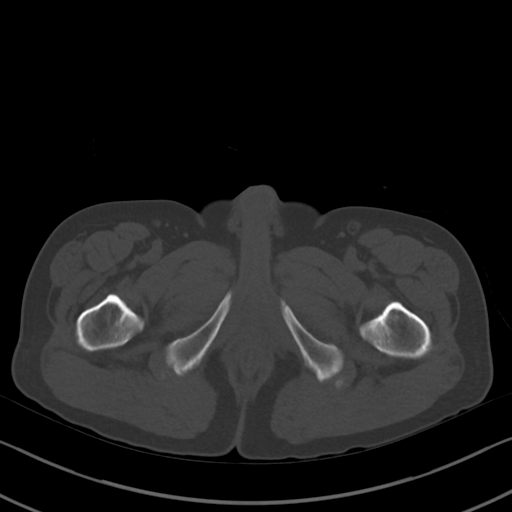
[im 14/89  soft-tissue]
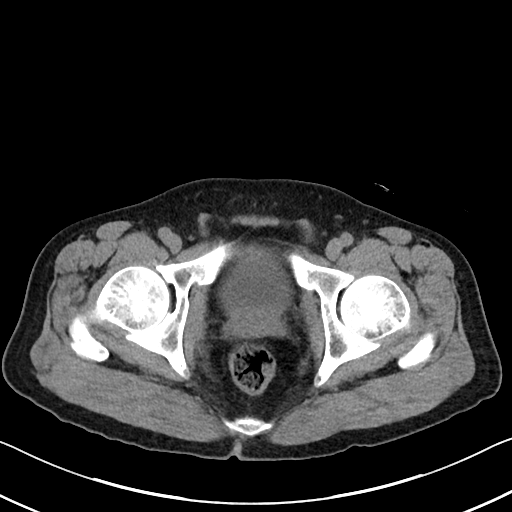
[im 19/89  soft-tissue]
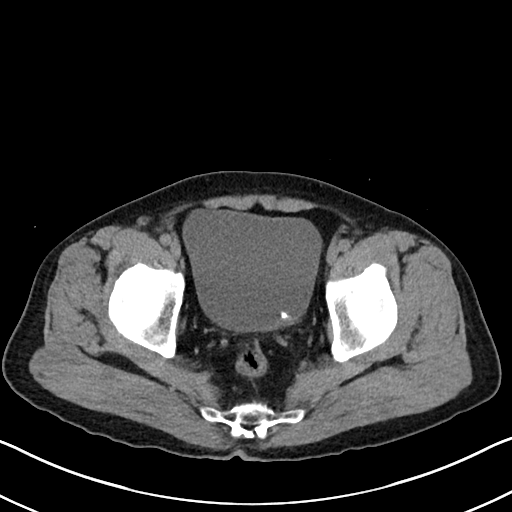
[im 24/89  soft-tissue]
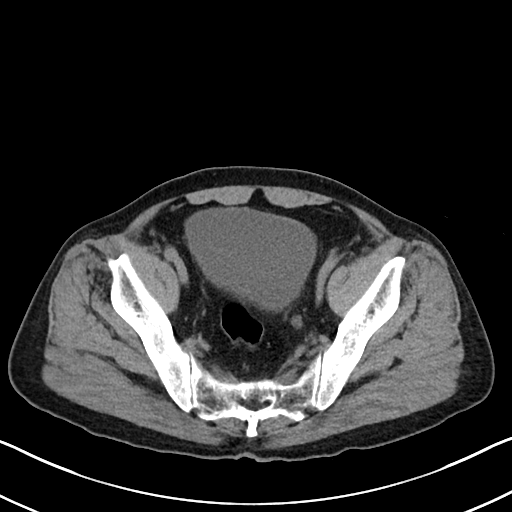
[im 33/89  soft-tissue]
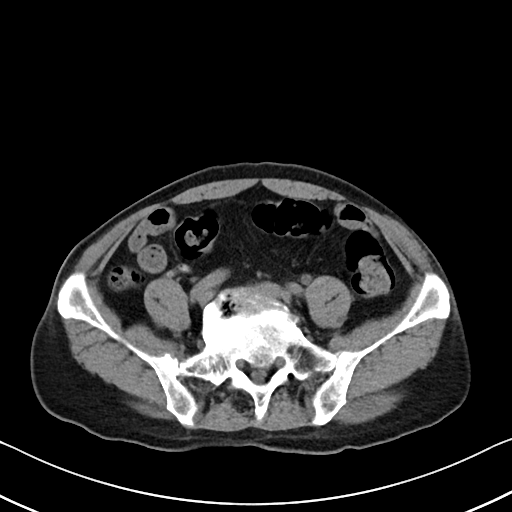
[im 38/89  soft-tissue]
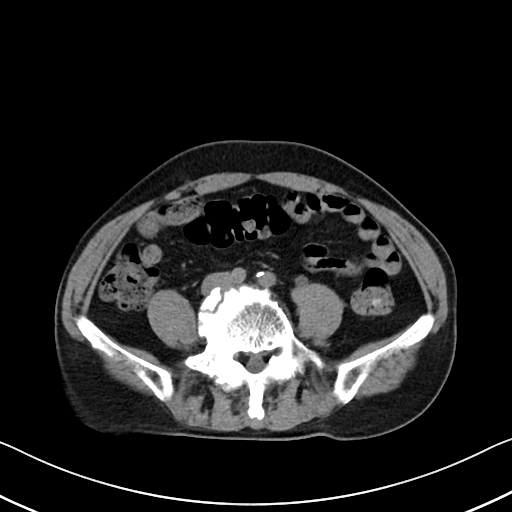
[im 47/89  soft-tissue]
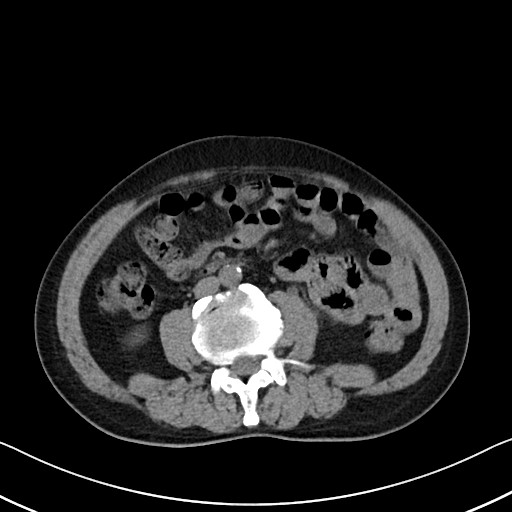
[im 51/89  soft-tissue]
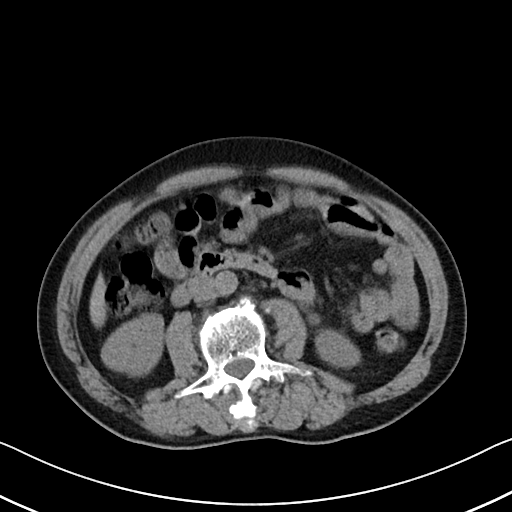
[im 56/89  soft-tissue]
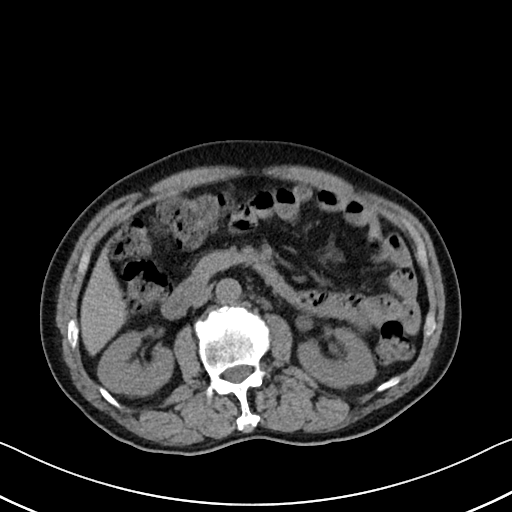
[im 56/89  bone]
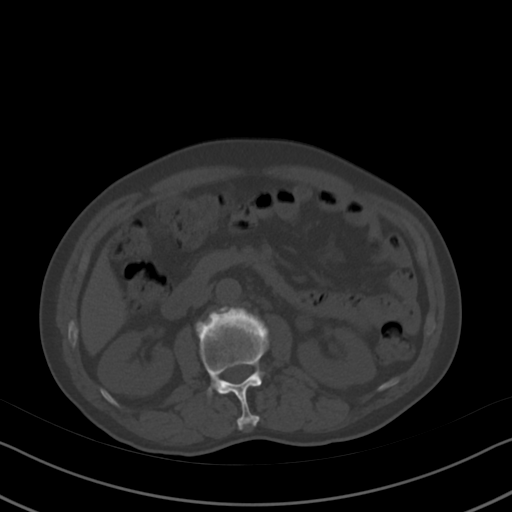
[im 65/89  soft-tissue]
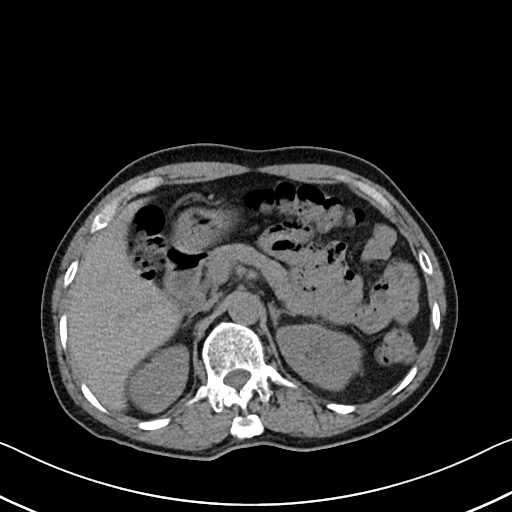
[im 70/89  soft-tissue]
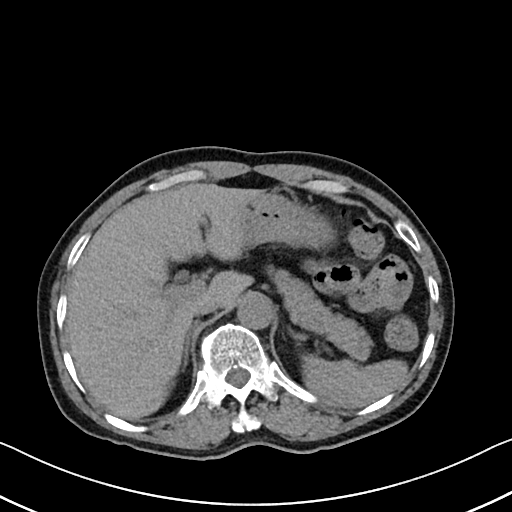
[im 75/89  soft-tissue]
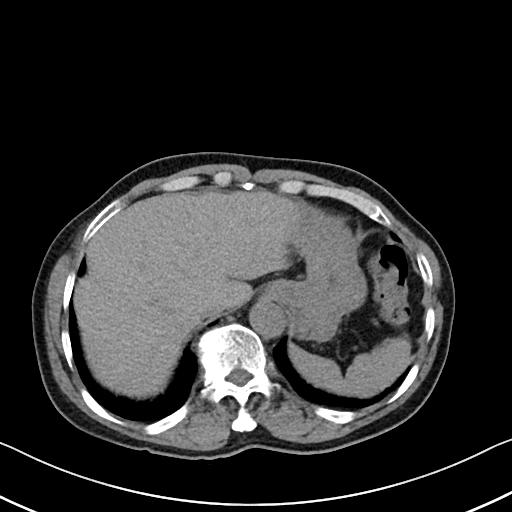
[im 84/89  soft-tissue]
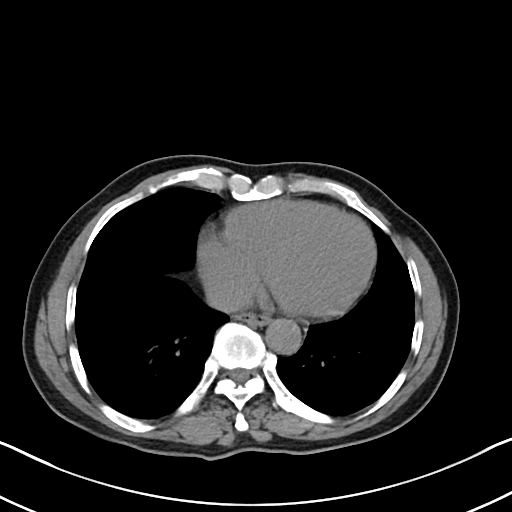

[Series 6: cor · coronal · 0.72mm/px · 3 of 98 slices shown]
[im 33/98  soft-tissue]
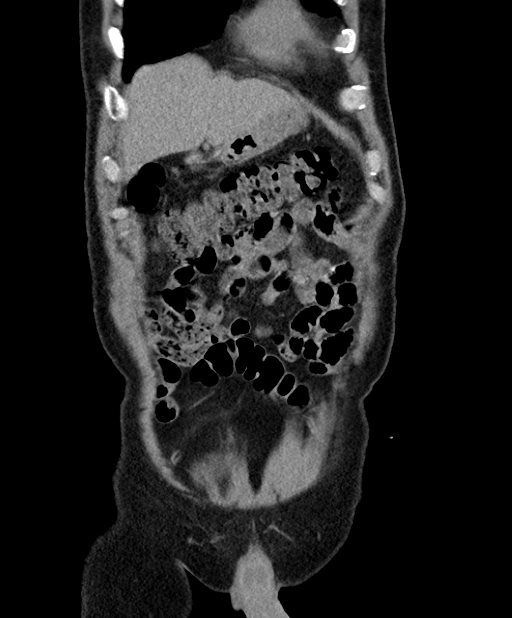
[im 44/98  soft-tissue]
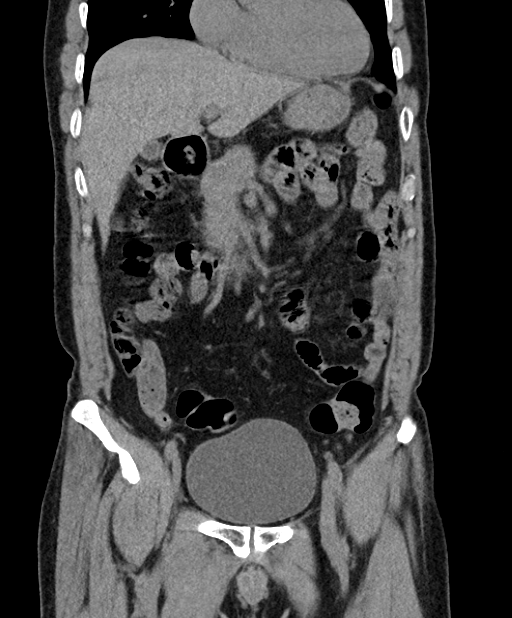
[im 54/98  soft-tissue]
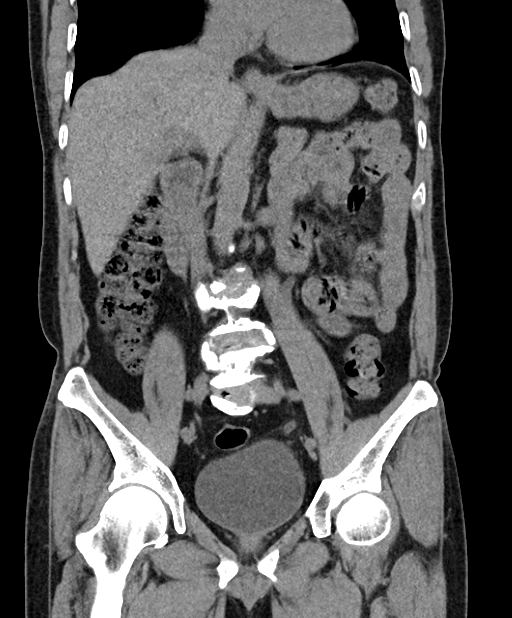

[16 of 46 positions shown; findings below may reference images not displayed]

FINDINGS: Lower chest: The visualized lung bases are grossly clear. The
visualized portions of the mediastinum are unremarkable.

Hepatobiliary: The liver is unremarkable in appearance. The
gallbladder is unremarkable in appearance. The common bile duct
remains normal in caliber.

Pancreas: The pancreas is within normal limits.

Spleen: The spleen is unremarkable in appearance.

Adrenals/Urinary Tract: The adrenal glands are unremarkable in
appearance.

Mild left-sided hydronephrosis is noted. This reflects 2 obstructing
stones distally at the left vesicoureteral junction, measuring 5 x 4
mm and 4 mm in size.

No significant perinephric stranding is seen. No nonobstructing
renal stones are identified.

Stomach/Bowel: The stomach is unremarkable in appearance. The small
bowel is within normal limits. The appendix is normal in caliber,
without evidence of appendicitis. The colon is unremarkable in
appearance.

Vascular/Lymphatic: Scattered calcification is seen along the
abdominal aorta and its branches. The abdominal aorta is otherwise
grossly unremarkable. The inferior vena cava is grossly
unremarkable. No retroperitoneal lymphadenopathy is seen. No pelvic
sidewall lymphadenopathy is identified.

Reproductive: The bladder is mildly distended and otherwise
unremarkable. The prostate remains normal in size, with minimal
calcification.

Other: No additional soft tissue abnormalities are seen.

Musculoskeletal: No acute osseous abnormalities are identified.
There is a chronic fracture of an anterolateral osteophyte at the
superior endplate of L5. Mild degenerative change is noted along the
lumbar spine. The visualized musculature is unremarkable in
appearance.
IMPRESSION: Mild left-sided hydronephrosis noted. This reflects 2 obstructing
stones distally at the left vesicoureteral junction, measuring 5 x 4
mm and 4 mm in size.

Aortic Atherosclerosis ([id number]-R74.4).

## 2019-06-11 IMAGING — DX DG SHOULDER 2+V*R*
3 series · 3 of 3 positions shown · non-contrast
Comparison: None

CLINICAL DATA: RIGHT shoulder pain, acute

EXAM:
RIGHT SHOULDER - 2+ VIEW

[shoulder ap]
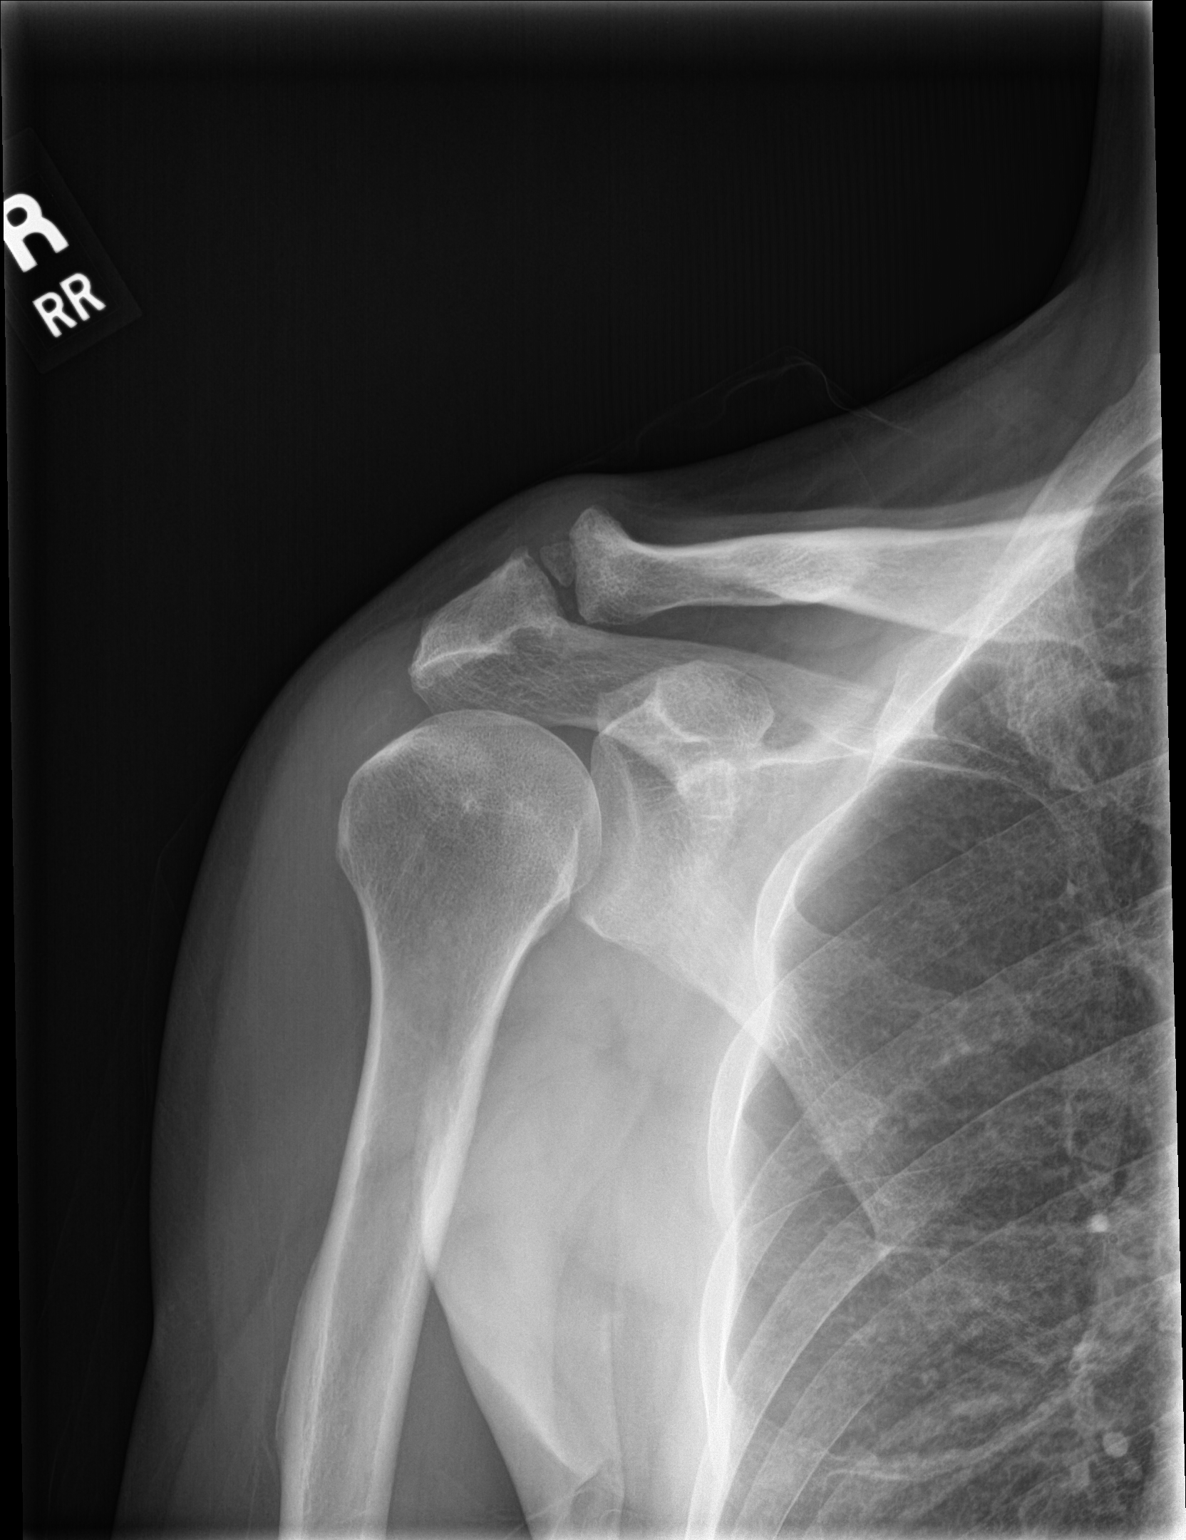

[shoulder y-view]
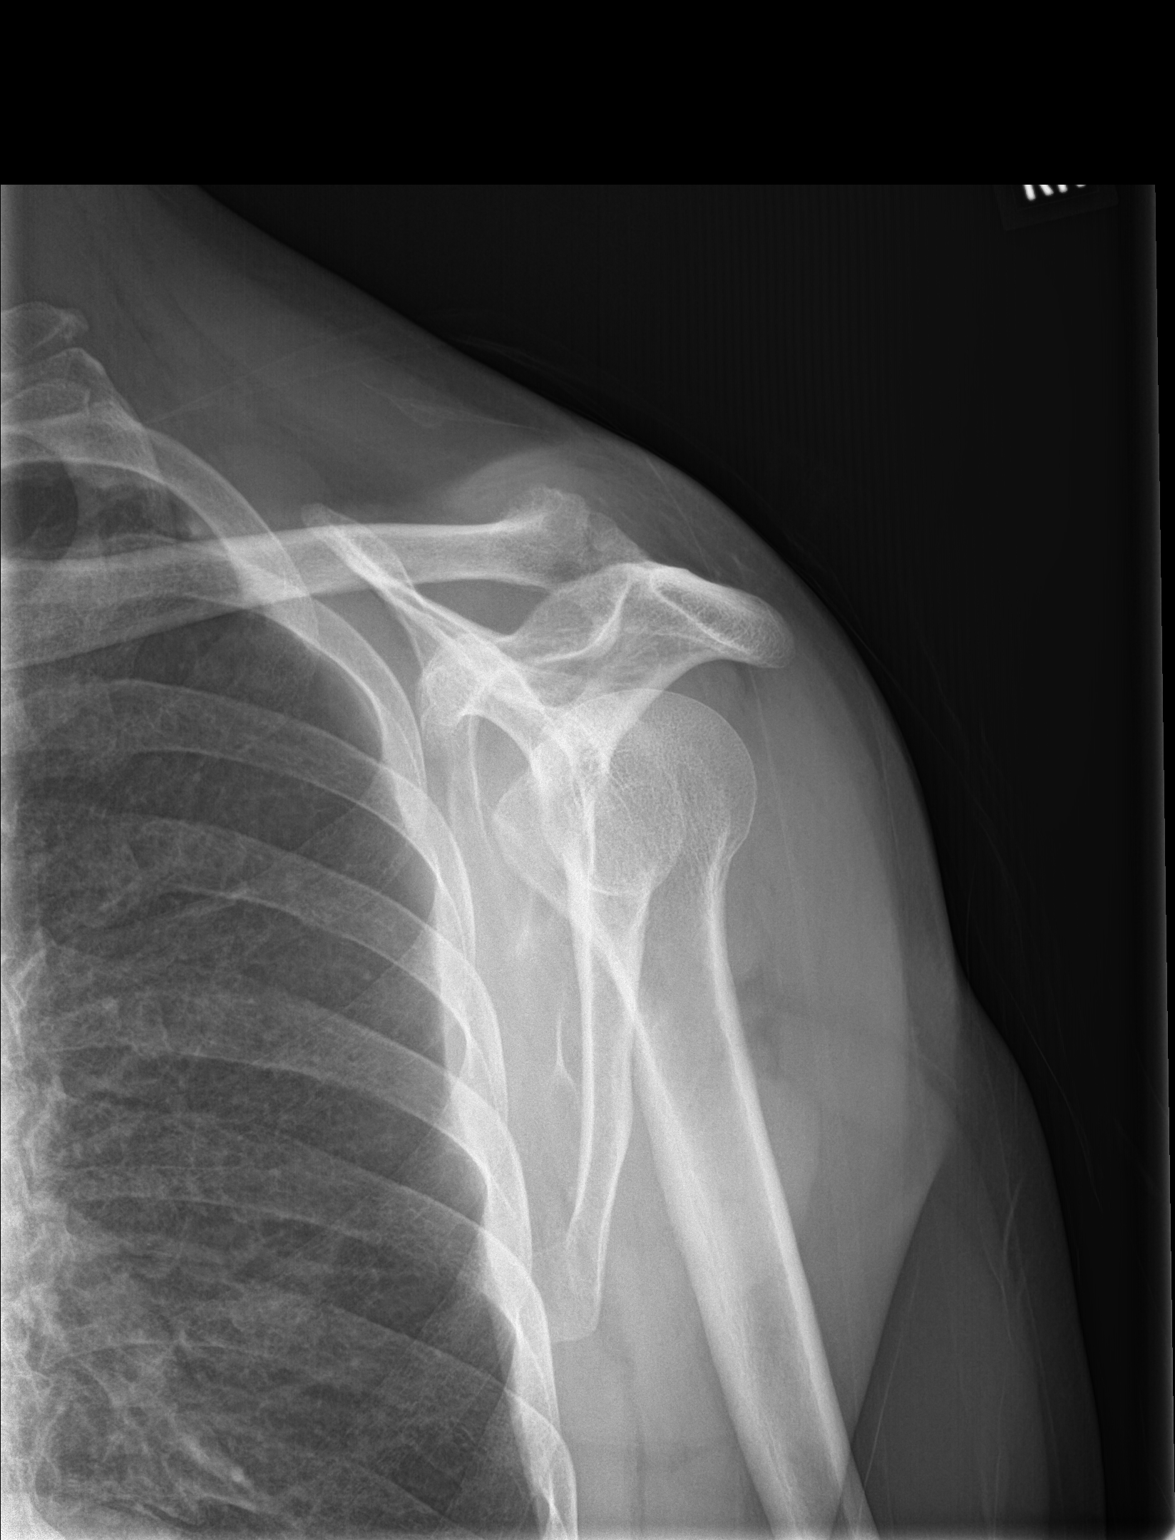

[shoulder axial]
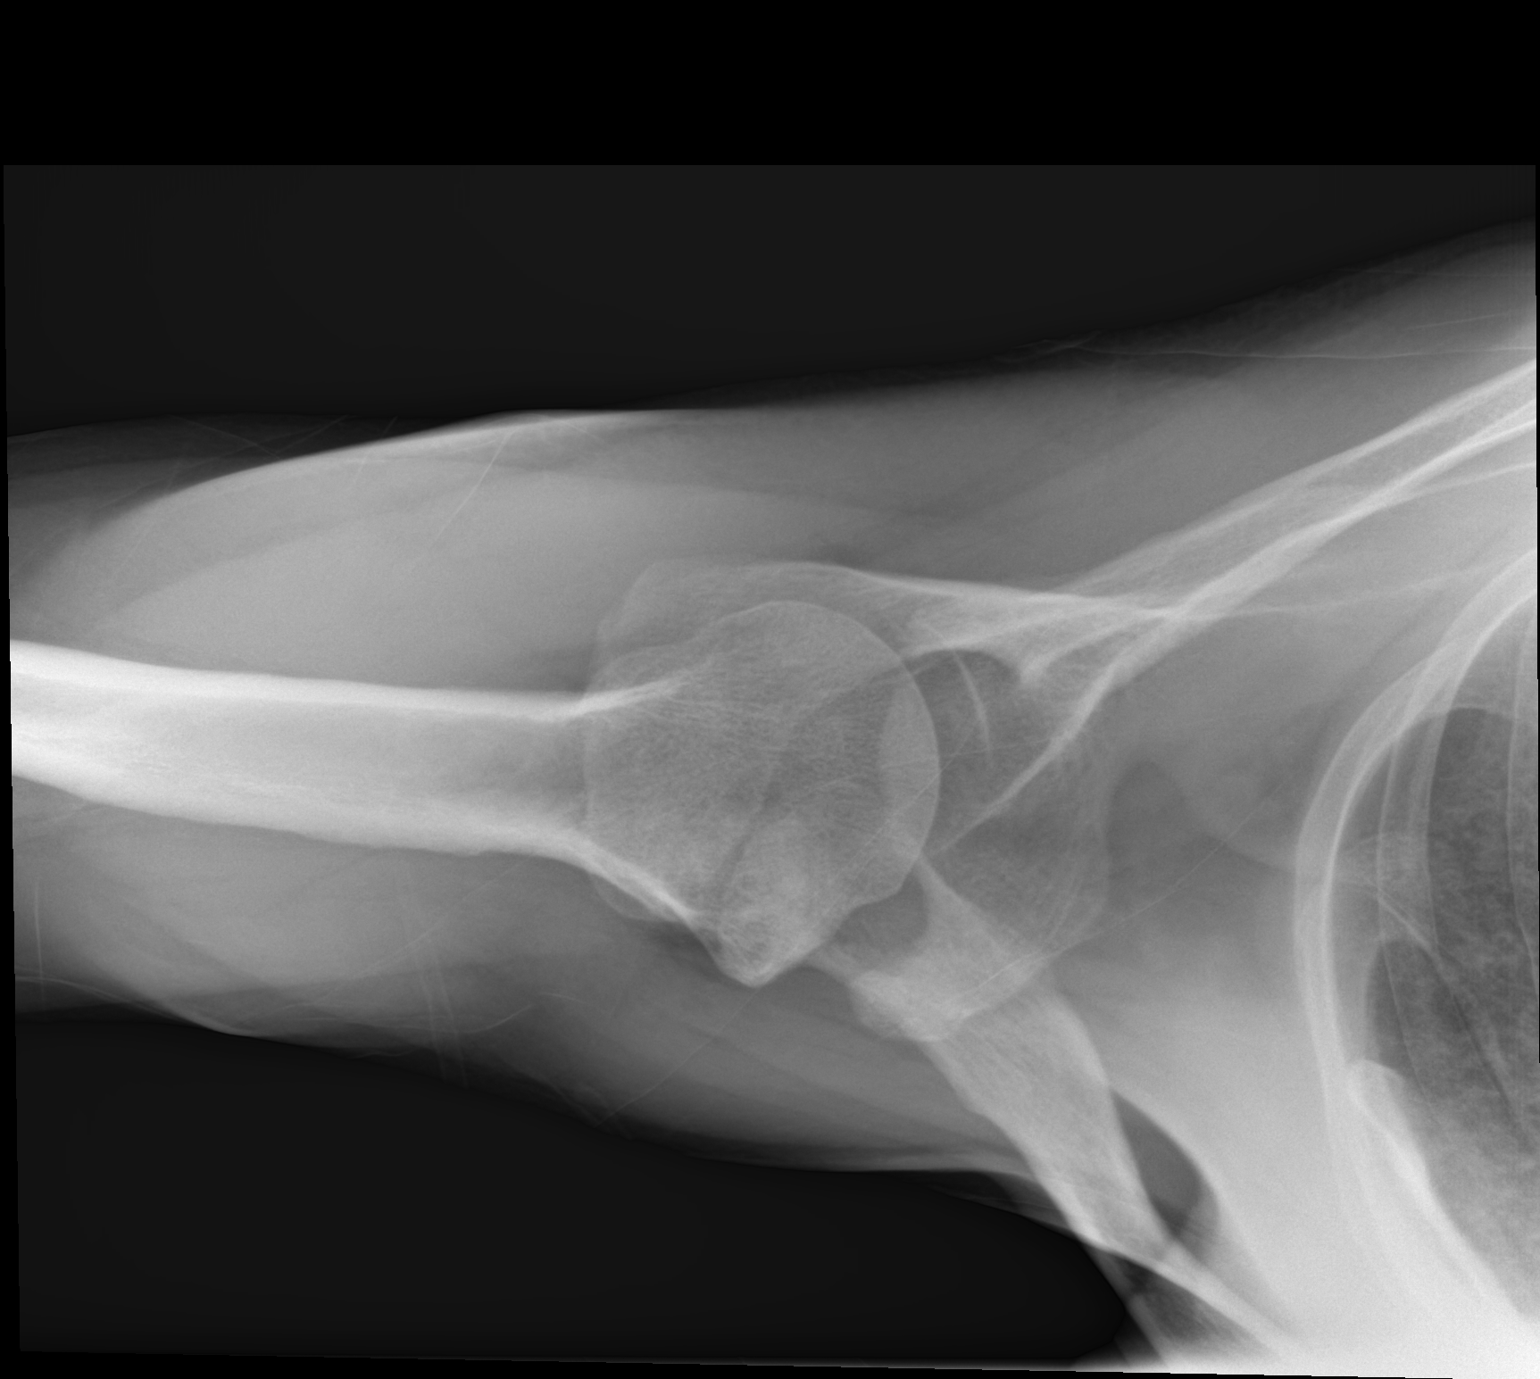

[3 of 3 positions shown; findings below may reference images not displayed]

FINDINGS: Degenerative changes RIGHT AC joint with minimal spurring and a
small calcified ossicle at the joint space.

Small inferior acromial spurs.

Low normal osseous mineralization.

No glenohumeral fracture, dislocation, or bone destruction.

Visualized RIGHT ribs intact.
IMPRESSION: RIGHT AC joint degenerative changes.

No acute abnormalities.

## 2019-12-06 ENCOUNTER — Ambulatory Visit: Payer: Self-pay | Attending: Internal Medicine

## 2019-12-06 ENCOUNTER — Other Ambulatory Visit: Payer: Self-pay

## 2019-12-06 DIAGNOSIS — Z23 Encounter for immunization: Secondary | ICD-10-CM

## 2019-12-06 NOTE — Progress Notes (Signed)
   Covid-19 Vaccination Clinic  Name:  Danil Wedge    MRN: [id number] DOB: 1955/04/24  12/06/2019  Mr. Kensinger was observed post Covid-19 immunization for 15 minutes without incident. He was provided with Vaccine Information Sheet and instruction to access the V-Safe system.   Mr. Fricker was instructed to call 911 with any severe reactions post vaccine: Marland Kitchen Difficulty breathing  . Swelling of face and throat  . A fast heartbeat  . A bad rash all over body  . Dizziness and weakness

## 2023-01-29 ENCOUNTER — Ambulatory Visit: Payer: Medicaid Other | Admitting: Internal Medicine

## 2023-01-29 ENCOUNTER — Encounter: Payer: Self-pay | Admitting: Internal Medicine

## 2023-01-29 VITALS — BP 132/84 | HR 67 | Temp 98.3°F | Ht 64.0 in | Wt 141.8 lb

## 2023-01-29 DIAGNOSIS — J209 Acute bronchitis, unspecified: Secondary | ICD-10-CM | POA: Diagnosis not present

## 2023-01-29 DIAGNOSIS — I10 Essential (primary) hypertension: Secondary | ICD-10-CM | POA: Insufficient documentation

## 2023-01-29 MED ORDER — METHYLPREDNISOLONE SODIUM SUCC 125 MG IJ SOLR
125.0000 mg | Freq: Once | INTRAMUSCULAR | Status: AC
  Administered 2023-01-29: 125 mg via INTRAMUSCULAR

## 2023-01-29 MED ORDER — ALBUTEROL SULFATE HFA 108 (90 BASE) MCG/ACT IN AERS: 2.0000 | INHALATION_SPRAY | RESPIRATORY_TRACT | 1 refills | Status: DC | PRN

## 2023-01-29 MED ORDER — PROMETHAZINE-DM 6.25-15 MG/5ML PO SYRP: 5.0000 mL | ORAL_SOLUTION | Freq: Four times a day (QID) | ORAL | 0 refills | Status: AC | PRN

## 2023-01-29 MED ORDER — AZITHROMYCIN 250 MG PO TABS: ORAL_TABLET | ORAL | 0 refills | Status: AC

## 2023-01-29 NOTE — Progress Notes (Signed)
[facility] [address] [place] [address] Dept: [phone] Dept Fax: [phone]  New Patient Office Visit  Subjective:   Saw Derek Lucero [birth year] 01/29/2023  Chief Complaint  Patient presents with   Cough    Started 10 days ago    Bronchitis    Started 10 days ago     HPI: Derek Lucero presents today to establish care at [facility] [employer]. Introduced to Publishing rights manager role and practice setting.  All questions answered.  Concerns: See below   Discussed the use of AI scribe software for clinical note transcription with the patient, who gave verbal consent to proceed.  History of Present Illness   The patient, with a history of bronchitis, presents with a persistent cough for the past ten days. The cough is severe, disrupting sleep for the patient and his family. Despite treatment with Tessalon Perles, Tylenol, Robitussin, Mucinex, and Chloraseptic throat spray, the cough has not improved. The patient has no fever, shortness of breath, or chest pain. He reports a similar episode last year, which resolved after treatment in [place]. The patient also reports occasional sinus congestion and has been coughing up white phlegm.      The following portions of the patient's history were reviewed and updated as appropriate: past medical history, past surgical history, family history, social history, allergies, medications, and problem list.   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 No pertinent past medical history. History reviewed. No pertinent surgical history. History reviewed. No pertinent family history.  Current Outpatient Medications:    Acetaminophen-DM (MUCINEX CHILD FEV,STHR,COUGH PO), Take by mouth., Disp: , Rfl:    albuterol  (VENTOLIN HFA) 108 (90 Base) MCG/ACT inhaler, Inhale 2 puffs into the lungs every 4 (four) hours as needed for wheezing or shortness of breath., Disp: 18 g, Rfl: 1   azithromycin (ZITHROMAX) 250 MG tablet, Take 2 tablets on day 1, then 1 tablet daily on days 2 through 5, Disp: 6 tablet, Rfl: 0   benzonatate (TESSALON) 100 MG capsule, Take by mouth., Disp: , Rfl:    hydrochlorothiazide (HYDRODIURIL) 25 MG tablet, Take by mouth., Disp: , Rfl:    IBUPROFEN PO, Take by mouth daily., Disp: , Rfl:    lisinopril (ZESTRIL) 20 MG tablet, Take by mouth., Disp: , Rfl:    promethazine-dextromethorphan (PROMETHAZINE-DM) 6.25-15 MG/5ML syrup, Take 5 mLs by mouth 4 (four) times daily as needed for cough., Disp: 180 mL, Rfl: 0  Current Facility-Administered Medications:    methylPREDNISolone sodium succinate (SOLU-MEDROL) 125 mg/2 mL injection 125 mg, 125 mg, Intramuscular, Once,  No Known Allergies  ROS: A complete ROS was performed with pertinent positives/negatives noted in the HPI. The remainder of the ROS are negative.   Objective:   Today's Vitals   01/29/23 1410  BP: 132/84  Pulse: 67  Temp: 98.3 F (36.8 C)  TempSrc: Temporal  SpO2: 98%  Weight: 141 lb 12.8 oz (64.3 kg)  Height: 5\' 4"  (1.626 m)    GENERAL: Well-appearing, in NAD. Well nourished.  SKIN: Pink, warm and dry. No rash, lesion, ulceration, or ecchymoses.  HEENT:    HEAD: Normocephalic, non-traumatic.  EYES: Conjunctive pink without exudate. PERRL, EOMI.  EARS: External ear w/o redness, swelling, masses, or lesions. EAC clear. TM's intact, translucent w/o bulging, appropriate landmarks visualized.  NOSE: Septum  midline w/o deformity. Nares patent, mucosa pink and inflamed turbinates w/o drainage. No sinus tenderness.  THROAT: Uvula midline. Oropharynx clear. Tonsils non-inflamed w/o exudate. Mucus membranes pink and moist.  NECK: Trachea midline. Full ROM w/o pain or tenderness. No lymphadenopathy.  RESPIRATORY: Chest wall  symmetrical. Respirations even and non-labored. Breath sounds clear to auscultation bilaterally. (+)coarse, deep cough EXTREMITIES: Without clubbing, cyanosis, or edema.  NEUROLOGIC: No motor or sensory deficits. Steady, even gait.  PSYCH/MENTAL STATUS: Alert, oriented x 3. Cooperative, appropriate mood and affect.   Health Maintenance Due  Topic Date Due   Pneumonia Vaccine 17+ Years old (1 of 2 - PCV) Never done   HIV Screening  Never done   Hepatitis C Screening  Never done   DTaP/Tdap/Td (1 - Tdap) Never done   Colonoscopy  Never done   Zoster Vaccines- Shingrix (1 of 2) Never done    No results found for any visits on 01/29/23.  Assessment & Plan:  Assessment and Plan    Acute Bronchitis Persistent cough for 10 days, previously diagnosed with bronchitis at urgent care. No fever, chest pain, or shortness of breath. Chest X-ray showed no pneumonia. -Administer Solu-Medrol 125mg  injection today. -Start antibiotic therapy. -Prescribe cough syrup and albuterol inhaler -Continue Mucinex (not DM) and Chloraseptic spray. -Consider use of saline nasal spray or Flonase for sinus congestion. -Return in 1 week if no improvement or sooner if condition worsens.  Follow-up Plan for routine check-up in 4 months or sooner if needed.       Meds ordered this encounter  Medications   azithromycin (ZITHROMAX) 250 MG tablet    Sig: Take 2 tablets on day 1, then 1 tablet daily on days 2 through 5    Dispense:  6 tablet    Refill:  0    Order Specific Question:   Supervising Provider    Answer:   Garnette Gunner [[id number]]   promethazine-dextromethorphan (PROMETHAZINE-DM) 6.25-15 MG/5ML syrup    Sig: Take 5 mLs by mouth 4 (four) times daily as needed for cough.    Dispense:  180 mL    Refill:  0    Order Specific Question:   Supervising Provider    Answer:   Garnette Gunner [[id number]]   methylPREDNISolone sodium succinate (SOLU-MEDROL) 125 mg/2 mL injection 125 mg   albuterol  (VENTOLIN HFA) 108 (90 Base) MCG/ACT inhaler    Sig: Inhale 2 puffs into the lungs every 4 (four) hours as needed for wheezing or shortness of breath.    Dispense:  18 g    Refill:  1    Order Specific Question:   Supervising Provider    Answer:   Garnette Gunner [[id number]]    Return in about 4 months (around 05/30/2023) for Chronic Condition follow up, and as needed if symptoms worsen or do not improve.   Salvatore Decent, FNP

## 2023-01-29 NOTE — Patient Instructions (Addendum)
Rest, drink plenty of fluids.  Tylenol for fever, body aches.   For cough: Take Mucinex (Plain) over-the-counter.  Follow the instructions in the box. Can take cough syrup as prescribed  For nasal and sinus congestion: Use over-the-counter Flonase: 2 nasal sprays on each side of the nose in the morning until you feel better Use saline spray     If you have been prescribed an antibiotic, take as prescribed.   Call if not gradually better over the next  10 days   Call anytime if the symptoms are severe, you have high fever, short of breath, chest pain

## 2023-02-19 NOTE — Progress Notes (Deleted)
  [facility] [address] [place] [address] Dept: [phone] Dept Fax: [phone]  Acute Care Office Visit  Subjective:   Derek Lucero 09-11-1955 02/20/2023  No chief complaint on file.   HPI: Discussed the use of AI scribe software for clinical note transcription with the patient, who gave verbal consent to proceed.  History of Present Illness             The following portions of the patient's history were reviewed and updated as appropriate: past medical history, past surgical history, family history, social history, allergies, medications, and problem list.   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 No past surgical history on file. No family history on file.  Current Outpatient Medications:    Acetaminophen -DM (MUCINEX CHILD FEV,STHR,COUGH PO), Take by mouth., Disp: , Rfl:    albuterol  (VENTOLIN  HFA) 108 (90 Base) MCG/ACT inhaler, Inhale 2 puffs into the lungs every 4 (four) hours as needed for wheezing or shortness of breath., Disp: 18 g, Rfl: 1   hydrochlorothiazide (HYDRODIURIL) 25 MG tablet, Take by mouth., Disp: , Rfl:    IBUPROFEN  PO, Take by mouth daily., Disp: , Rfl:    lisinopril (ZESTRIL) 20 MG tablet, Take by mouth., Disp: , Rfl:    promethazine -dextromethorphan (PROMETHAZINE -DM) 6.25-15 MG/5ML syrup, Take 5 mLs by mouth 4 (four) times daily as needed for cough., Disp: 180 mL, Rfl: 0 No Known Allergies   ROS: A complete ROS was performed with pertinent positives/negatives noted in the HPI. The remainder of the ROS are negative.    Objective:   There were no vitals filed for this visit.  GENERAL: Well-appearing, in NAD. Well nourished.  SKIN: Pink, warm and dry. No rash, lesion, ulceration, or ecchymoses.  NECK:  Trachea midline. Full ROM w/o pain or tenderness. No lymphadenopathy.  RESPIRATORY: Chest wall symmetrical. Respirations even and non-labored. Breath sounds clear to auscultation bilaterally.  CARDIAC: S1, S2 present, regular rate and rhythm. Peripheral pulses 2+ bilaterally.  MSK: Muscle tone and strength appropriate for age. Joints w/o tenderness, redness, or swelling. EXTREMITIES: Without clubbing, cyanosis, or edema.  NEUROLOGIC: No motor or sensory deficits. Steady, even gait.  PSYCH/MENTAL STATUS: Alert, oriented x 3. Cooperative, appropriate mood and affect.    No results found for any visits on 02/20/23.    Assessment & Plan:  Assessment and Plan               There are no diagnoses linked to this encounter. No orders of the defined types were placed in this encounter.  No orders of the defined types were placed in this encounter.  Lab Orders  No laboratory test(s) ordered today   No images are attached to the encounter or orders placed in the encounter.  No follow-ups on file.   Rosina Senters, FNP

## 2023-02-20 ENCOUNTER — Ambulatory Visit: Payer: Medicaid Other | Admitting: Internal Medicine

## 2023-02-28 ENCOUNTER — Telehealth: Payer: Self-pay | Admitting: General Practice

## 2023-02-28 NOTE — Telephone Encounter (Signed)
 02/20/2023 same day cancel e2c2, no reason noted, 1st missed visit, letter sent via mail

## 2023-03-01 NOTE — Telephone Encounter (Signed)
 Provider aware

## 2023-05-24 ENCOUNTER — Encounter: Payer: Self-pay | Admitting: Internal Medicine

## 2023-05-24 ENCOUNTER — Ambulatory Visit: Admitting: Internal Medicine

## 2023-05-24 VITALS — BP 122/80 | HR 87 | Temp 98.3°F | Ht 64.0 in | Wt 144.2 lb

## 2023-05-24 DIAGNOSIS — M5441 Lumbago with sciatica, right side: Secondary | ICD-10-CM

## 2023-05-24 DIAGNOSIS — Z23 Encounter for immunization: Secondary | ICD-10-CM

## 2023-05-24 MED ORDER — KETOROLAC TROMETHAMINE 60 MG/2ML IM SOLN
60.0000 mg | Freq: Once | INTRAMUSCULAR | Status: AC
  Administered 2023-05-24: 60 mg via INTRAMUSCULAR

## 2023-05-24 MED ORDER — IBUPROFEN 800 MG PO TABS: 800.0000 mg | ORAL_TABLET | Freq: Three times a day (TID) | ORAL | 0 refills | Status: DC | PRN

## 2023-05-24 MED ORDER — METHYLPREDNISOLONE 4 MG PO TBPK: ORAL_TABLET | ORAL | 0 refills | Status: DC

## 2023-05-24 MED ORDER — METHOCARBAMOL 500 MG PO TABS: 500.0000 mg | ORAL_TABLET | Freq: Three times a day (TID) | ORAL | 0 refills | Status: DC | PRN

## 2023-05-24 NOTE — Progress Notes (Signed)
 [facility] [address] [place] [address] Dept: [phone] Dept Fax: [phone]  Acute Care Office Visit  Subjective:   Derek Lucero 1956-02-11 05/24/2023  Chief Complaint  Patient presents with   Back Pain    History of Hieratic disk Back pain started 2 weeks ago and getting worse     HPI: Discussed the use of AI scribe software for clinical note transcription with the patient, who gave verbal consent to proceed.  History of Present Illness   The patient, with a history of herniated disc and sciatica, presents with lower back pain radiating down the right leg to the foot. The pain, described as 'like an electric running down,' started two weeks ago and worsened two days ago. The patient reports the pain is more pronounced on the right side. He denies any similar symptoms in the left leg. The pain is exacerbated by standing up from a sitting position and is relieved by sitting down. Over-the-counter Motrin provides temporary relief. The patient denies any urinary symptoms associated with the back pain. 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 No numbness in extremities, no bowel/bladder incontinence.         The following portions of the patient's history were reviewed and updated as appropriate: past medical history, past surgical history, family history, social history, allergies, medications, and problem list.   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 No pertinent past medical history. History reviewed. No pertinent surgical history. History reviewed. No pertinent family history.  Current Outpatient Medications:    albuterol (VENTOLIN HFA) 108 (90  Base) MCG/ACT inhaler, Inhale 2 puffs into the lungs every 4 (four) hours as needed for wheezing or shortness of breath., Disp: 18 g, Rfl: 1   hydrochlorothiazide (HYDRODIURIL) 25 MG tablet, Take by mouth., Disp: , Rfl:    ibuprofen (ADVIL) 800 MG tablet, Take 1 tablet (800 mg total) by mouth every 8 (eight) hours as needed (pain)., Disp: 30 tablet, Rfl: 0   lisinopril (ZESTRIL) 20 MG tablet, Take by mouth., Disp: , Rfl:    methocarbamol (ROBAXIN) 500 MG tablet, Take 1 tablet (500 mg total) by mouth every 8 (eight) hours as needed for muscle spasms., Disp: 30 tablet, Rfl: 0   methylPREDNISolone (MEDROL DOSEPAK) 4 MG TBPK tablet, Use as directed., Disp: 21 each, Rfl: 0   Acetaminophen-DM (MUCINEX CHILD FEV,STHR,COUGH PO), Take by mouth. (Patient not taking: Reported on 05/24/2023), Disp: , Rfl:    promethazine-dextromethorphan (PROMETHAZINE-DM) 6.25-15 MG/5ML syrup, Take 5 mLs by mouth 4 (four) times daily as needed for cough. (Patient not taking: Reported on 05/24/2023), Disp: 180 mL, Rfl: 0 No Known Allergies   ROS: A complete ROS was performed with pertinent positives/negatives noted in the HPI. The remainder of the ROS are negative.    Objective:   Today's Vitals   05/24/23 1515  BP: 122/80  Pulse: 87  Temp: 98.3 F (36.8 C)  TempSrc: Temporal  SpO2: 98%  Weight: 144 lb 3.2 oz (65.4 kg)  Height: 5\' 4"  (1.626 m)    GENERAL: Well-appearing, in NAD. Well nourished.  SKIN: Pink, warm and dry. No rash, lesion, ulceration, or ecchymoses.  NECK: Trachea midline. Full ROM w/o pain or tenderness. No lymphadenopathy.  RESPIRATORY: Chest  wall symmetrical. Respirations even and non-labored. Breath sounds clear to auscultation bilaterally.  CARDIAC: S1, S2 present, regular rate and rhythm. Peripheral pulses 2+ bilaterally.  MSK: Muscle tone and strength appropriate for age. (+) straight leg raise on RLE EXTREMITIES: Without clubbing, cyanosis, or edema.  NEUROLOGIC: No motor or sensory deficits.  Steady, even gait.  PSYCH/MENTAL STATUS: Alert, oriented x 3. Cooperative, appropriate mood and affect.    No results found for any visits on 05/24/23.    Assessment & Plan:  Assessment and Plan    Sciatica - Administer Toradol 60mg  IM - Prescribe Medrol Dosepak  - Prescribe methocarbamol 500mg  q8hr PRN - Continue ibuprofen for pain. - Provide stretching instructions. - Advise ice and heat packs for relief.   General Health Maintenance Due for tetanus vaccination. - Administer tetanus vaccination.       Meds ordered this encounter  Medications   methylPREDNISolone (MEDROL DOSEPAK) 4 MG TBPK tablet    Sig: Use as directed.    Dispense:  21 each    Refill:  0    Supervising Provider:   Garnette Gunner [[id number]]   methocarbamol (ROBAXIN) 500 MG tablet    Sig: Take 1 tablet (500 mg total) by mouth every 8 (eight) hours as needed for muscle spasms.    Dispense:  30 tablet    Refill:  0    Supervising Provider:   Garnette Gunner [[id number]]   ibuprofen (ADVIL) 800 MG tablet    Sig: Take 1 tablet (800 mg total) by mouth every 8 (eight) hours as needed (pain).    Dispense:  30 tablet    Refill:  0    Supervising Provider:   Garnette Gunner [[id number]]   ketorolac (TORADOL) injection 60 mg   Orders Placed This Encounter  Procedures   Tdap vaccine greater than or equal to 7yo IM   Lab Orders  No laboratory test(s) ordered today   No images are attached to the encounter or orders placed in the encounter.  Return in about 3 months (around 08/23/2023) for Chronic Condition follow up - labs at appointment.   Derek Decent, FNP

## 2023-07-02 ENCOUNTER — Encounter: Payer: Self-pay | Admitting: Internal Medicine

## 2023-07-02 ENCOUNTER — Ambulatory Visit (INDEPENDENT_AMBULATORY_CARE_PROVIDER_SITE_OTHER): Admitting: Internal Medicine

## 2023-07-02 VITALS — BP 102/70 | HR 63 | Temp 97.6°F | Ht 64.0 in | Wt 141.0 lb

## 2023-07-02 DIAGNOSIS — Z87891 Personal history of nicotine dependence: Secondary | ICD-10-CM

## 2023-07-02 DIAGNOSIS — Z136 Encounter for screening for cardiovascular disorders: Secondary | ICD-10-CM

## 2023-07-02 DIAGNOSIS — Z1211 Encounter for screening for malignant neoplasm of colon: Secondary | ICD-10-CM

## 2023-07-02 DIAGNOSIS — Z23 Encounter for immunization: Secondary | ICD-10-CM | POA: Diagnosis not present

## 2023-07-02 DIAGNOSIS — Z1159 Encounter for screening for other viral diseases: Secondary | ICD-10-CM

## 2023-07-02 DIAGNOSIS — Z13220 Encounter for screening for lipoid disorders: Secondary | ICD-10-CM

## 2023-07-02 DIAGNOSIS — Z125 Encounter for screening for malignant neoplasm of prostate: Secondary | ICD-10-CM | POA: Diagnosis not present

## 2023-07-02 DIAGNOSIS — J201 Acute bronchitis due to Hemophilus influenzae: Secondary | ICD-10-CM

## 2023-07-02 DIAGNOSIS — J209 Acute bronchitis, unspecified: Secondary | ICD-10-CM

## 2023-07-02 DIAGNOSIS — Z0000 Encounter for general adult medical examination without abnormal findings: Secondary | ICD-10-CM | POA: Diagnosis not present

## 2023-07-02 LAB — CBC WITH DIFFERENTIAL/PLATELET
Basophils Absolute: 0 10*3/uL (ref 0.0–0.1)
Basophils Relative: 0.4 % (ref 0.0–3.0)
Eosinophils Absolute: 0.6 10*3/uL (ref 0.0–0.7)
Eosinophils Relative: 8 % — ABNORMAL HIGH (ref 0.0–5.0)
HCT: 36.9 % — ABNORMAL LOW (ref 39.0–52.0)
Hemoglobin: 12.5 g/dL — ABNORMAL LOW (ref 13.0–17.0)
Lymphocytes Relative: 22.5 % (ref 12.0–46.0)
Lymphs Abs: 1.7 10*3/uL (ref 0.7–4.0)
MCHC: 34 g/dL (ref 30.0–36.0)
MCV: 90.8 fl (ref 78.0–100.0)
Monocytes Absolute: 0.6 10*3/uL (ref 0.1–1.0)
Monocytes Relative: 7.8 % (ref 3.0–12.0)
Neutro Abs: 4.6 10*3/uL (ref 1.4–7.7)
Neutrophils Relative %: 61.3 % (ref 43.0–77.0)
Platelets: 227 10*3/uL (ref 150.0–400.0)
RBC: 4.06 Mil/uL — ABNORMAL LOW (ref 4.22–5.81)
RDW: 13 % (ref 11.5–15.5)
WBC: 7.4 10*3/uL (ref 4.0–10.5)

## 2023-07-02 LAB — LIPID PANEL
Cholesterol: 228 mg/dL — ABNORMAL HIGH (ref 0–200)
HDL: 42.7 mg/dL (ref >39.00)
LDL Cholesterol: 155 mg/dL — ABNORMAL HIGH (ref 0–99)
NonHDL: 185.08
Total CHOL/HDL Ratio: 5
Triglycerides: 150 mg/dL — ABNORMAL HIGH (ref 0.0–149.0)
VLDL: 30 mg/dL (ref 0.0–40.0)

## 2023-07-02 LAB — COMPREHENSIVE METABOLIC PANEL WITH GFR
ALT: 9 U/L (ref 0–53)
AST: 19 U/L (ref 0–37)
Albumin: 4.5 g/dL (ref 3.5–5.2)
Alkaline Phosphatase: 81 U/L (ref 39–117)
BUN: 21 mg/dL (ref 6–23)
CO2: 26 meq/L (ref 19–32)
Calcium: 9.1 mg/dL (ref 8.4–10.5)
Chloride: 101 meq/L (ref 96–112)
Creatinine, Ser: 1.02 mg/dL (ref 0.40–1.50)
GFR: 76.86 mL/min (ref >60.00)
Glucose, Bld: 101 mg/dL — ABNORMAL HIGH (ref 70–99)
Potassium: 4.3 meq/L (ref 3.5–5.1)
Sodium: 137 meq/L (ref 135–145)
Total Bilirubin: 0.5 mg/dL (ref 0.2–1.2)
Total Protein: 7.2 g/dL (ref 6.0–8.3)

## 2023-07-02 LAB — TSH: TSH: 2.12 u[IU]/mL (ref 0.35–5.50)

## 2023-07-02 LAB — PSA: PSA: 1.11 ng/mL (ref 0.10–4.00)

## 2023-07-02 MED ORDER — AZITHROMYCIN 250 MG PO TABS: ORAL_TABLET | ORAL | 0 refills | Status: AC

## 2023-07-02 MED ORDER — METHYLPREDNISOLONE 4 MG PO TBPK: ORAL_TABLET | ORAL | 0 refills | Status: AC

## 2023-07-02 NOTE — Progress Notes (Signed)
 Subjective:   Derek Lucero 13-Jan-1956 07/02/2023  CC: Chief Complaint  Patient presents with   Annual Exam   Cough    HPI: Derek Lucero is a 68 y.o. male who presents for a routine health maintenance exam.  Labs collected at time of visit.   Patient complains of cough ongoing for at least 1 week. Was seen at urgent care last week. Given Tessalon  perles, nasal spray,  and cough syrup, which he reports did not help. No fever, CP,SHOB, wheezing. Daughter reports patient was having sinus congestion as well at that time, which has improved with nasal spray.   HEALTH SCREENINGS: - PSA (50+): Ordered today  No results found for: "PSA1", "PSA"   - Colonoscopy (45+): Declined  colonoscopy, patient prefers cologuard  Discussed with patient purpose of the colonoscopy is to detect colon cancer at curable precancerous or early stages  - AAA Screening: Ordered today  Men age 14-75 who have ever smoked - Lung Cancer screening with low-dose CT: Ordered today- reports smoking at least 4-5 cigarettes per day for > 20 years.  Adults age 66-80 who are current cigarette smokers or quit within the last 15 years. Must have 20 pack year history.   Depression and Anxiety Screen done today and results listed below:     07/02/2023    2:19 PM 05/24/2023    3:13 PM 01/29/2023    2:13 PM 07/31/2017    2:33 PM 04/04/2017   12:03 PM  Depression screen PHQ 2/9  Decreased Interest 0 0 1 0 0  Down, Depressed, Hopeless 0 0 2 0 0  PHQ - 2 Score 0 0 3 0 0  Altered sleeping 0  2    Tired, decreased energy 1  2    Change in appetite 0  1    Feeling bad or failure about yourself  0  1    Trouble concentrating 0  2    Moving slowly or fidgety/restless 0  1    Suicidal thoughts 0  0    PHQ-9 Score 1  12    Difficult doing work/chores Not difficult at all  Somewhat difficult        07/02/2023    2:20 PM 01/29/2023    2:14 PM  GAD 7 : Generalized Anxiety Score  Nervous, Anxious, on Edge 1 0  Control/stop worrying 1 1   Worry too much - different things 1 0  Trouble relaxing 0 1  Restless 0 0  Easily annoyed or irritable 0 1  Afraid - awful might happen 0 0  Total GAD 7 Score 3 3  Anxiety Difficulty Not difficult at all Somewhat difficult    IMMUNIZATIONS:  - Tdap: Tetanus vaccination status reviewed: last tetanus booster within 10 years. - Influenza: Postponed to flu season - Prevnar: Administered today    Past medical history, surgical history, medications, allergies, family history and social history reviewed with patient today and changes made to appropriate areas of the chart.   History reviewed. No pertinent past medical history.  History reviewed. No pertinent surgical history.  Current Outpatient Medications on File Prior to Visit  Medication Sig   hydrochlorothiazide (HYDRODIURIL) 25 MG tablet Take by mouth.   lisinopril (ZESTRIL) 20 MG tablet Take by mouth.   promethazine -dextromethorphan (PROMETHAZINE -DM) 6.25-15 MG/5ML syrup Take 5 mLs by mouth 4 (four) times daily as needed for cough. (Patient not taking: Reported on 07/02/2023)   No current facility-administered medications on file prior to visit.  No Known Allergies   Social History   Socioeconomic History   Marital status: Married    Spouse name: Not on file   Number of children: Not on file   Years of education: Not on file   Highest education level: Not on file  Occupational History   Not on file  Tobacco Use   Smoking status: Former    Current packs/day: 0.00    Types: Cigarettes    Quit date: 2023    Years since quitting: 2.3   Smokeless tobacco: Never  Substance and Sexual Activity   Alcohol use: Yes    Comment: occasionally   Drug use: Never   Sexual activity: Not on file  Other Topics Concern   Not on file  Social History Narrative   Not on file   Social Drivers of Health   Financial Resource Strain: Not on file  Food Insecurity: Not on file  Transportation Needs: Not on file  Physical  Activity: Not on file  Stress: Not on file  Social Connections: Not on file  Intimate Partner Violence: Not on file   Social History   Tobacco Use  Smoking Status Former   Current packs/day: 0.00   Types: Cigarettes   Quit date: 2023   Years since quitting: 2.3  Smokeless Tobacco Never   Social History   Substance and Sexual Activity  Alcohol Use Yes   Comment: occasionally     History reviewed. No pertinent family history.   ROS: Denies fever, fatigue, unexplained weight loss/gain, hearing or vision changes, cardiac complaints. Denies neurological deficits, musculoskeletal complaints, gastrointestinal or genitourinary complaints, mental health complaints, and skin changes.   Objective:   Today's Vitals   07/02/23 1416  BP: 102/70  Pulse: 63  Temp: 97.6 F (36.4 C)  TempSrc: Temporal  SpO2: 98%  Weight: 141 lb (64 kg)  Height: 5\' 4"  (1.626 m)    GENERAL APPEARANCE: Well-appearing, in NAD. Well nourished.  SKIN: Pink, warm and dry. Turgor normal. No rash, lesion, ulceration, or ecchymoses. Hair evenly distributed.  HEENT: HEAD: Normocephalic.  EYES: PERRLA. EOMI. Lids intact w/o defect. Sclera white, Conjunctiva pink w/o exudate.  EARS: External ear w/o redness, swelling, masses or lesions. EAC clear. TM's intact, translucent w/o bulging, appropriate landmarks visualized. Appropriate acuity to conversational tones.  NOSE: Septum midline w/o deformity. Nares patent, mucosa pink and non-inflamed w/o drainage. No sinus tenderness.  THROAT: Uvula midline. Oropharynx clear. Tonsils non-inflamed w/o exudate. Oral mucosa pink and moist.  NECK: Supple, Trachea midline. Full ROM w/o pain or tenderness. No lymphadenopathy. Thyroid non-tender w/o enlargement or palpable masses.  RESPIRATORY: Chest wall symmetrical w/o masses. Respirations even and non-labored. Breath sounds clear to auscultation bilaterally. No wheezes, rales, rhonchi, or crackles. (+) dry cough  CARDIAC: S1,  S2 present, regular rate and rhythm. No gallops, murmurs, rubs, or clicks. No carotid bruits. Capillary refill <2 seconds. Peripheral pulses 2+ bilaterally. GI: Abdomen soft w/o distention. Normoactive bowel sounds. No palpable masses or tenderness. No guarding or rebound tenderness. Liver and spleen w/o tenderness or enlargement. No CVA tenderness.  GU:  deferred exam. MSK: Muscle tone and strength appropriate for age, w/o atrophy or abnormal movement. EXTREMITIES: Active ROM intact, w/o tenderness, crepitus, or contracture. No obvious joint deformities or effusions. No clubbing, edema, or cyanosis.  NEUROLOGIC: CN's II-XII intact. Motor strength symmetrical with no obvious weakness. No sensory deficits. Steady, even gait.  PSYCH/MENTAL STATUS: Alert, oriented x 3. Cooperative, appropriate mood and affect.    Assessment &  Plan:  1. Annual physical exam (Primary) - CBC with Differential/Platelet - Comprehensive metabolic panel with GFR - TSH - Lipid panel - PSA  2. Colon cancer screening - Cologuard  3. Prostate cancer screening - PSA  4. Encounter for abdominal aortic aneurysm (AAA) screening - US  AORTA DUPLEX COMPLETE; Future  5. Former tobacco use - US  AORTA DUPLEX COMPLETE; Future - CT CHEST LUNG CANCER SCREENING LOW DOSE WO CONTRAST; Future  6. Need for hepatitis C screening test - Hepatitis C antibody  7. Immunization due - Pneumococcal conjugate vaccine 20-valent  8. Acute bronchitis, unspecified organism - methylPREDNISolone  (MEDROL  DOSEPAK) 4 MG TBPK tablet; Use as directed.  Dispense: 21 each; Refill: 0 - azithromycin  (ZITHROMAX ) 250 MG tablet; Take 2 tablets on day 1, then 1 tablet daily on days 2 through 5  Dispense: 6 tablet; Refill: 0 - continue cough syrup as needed  - continue nasal spray      Orders Placed This Encounter  Procedures   US  AORTA DUPLEX COMPLETE    Standing Status:   Future    Expiration Date:   07/01/2024    Reason for Exam (SYMPTOM  OR  DIAGNOSIS REQUIRED):   former tobacco use    Preferred imaging location?:   [facility]   CT CHEST LUNG CANCER SCREENING LOW DOSE WO CONTRAST    Standing Status:   Future    Expiration Date:   07/01/2024    Preferred Imaging Location?:   [facility]   Pneumococcal conjugate vaccine 20-valent   CBC with Differential/Platelet   Comprehensive metabolic panel with GFR   TSH   Lipid panel   PSA   Hepatitis C antibody   Cologuard    PATIENT COUNSELING: - Encouraged to adjust caloric intake to maintain or achieve ideal body weight, to reduce intake of dietary saturated fat and total fat, to limit sodium intake by avoiding high sodium foods and not adding table salt, and to maintain adequate dietary potassium and calcium preferably from fresh fruits, vegetables, and low-fat dairy products.   - Advised to avoid cigarette smoking. - Discussed with the patient that most people either abstain from alcohol or drink within safe limits (<=14/week and <=4 drinks/occasion for males, <=7/weeks and <= 3 drinks/occasion for females) and that the risk for alcohol disorders and other health effects rises proportionally with the number of drinks per week and how often a drinker exceeds daily limits. - Discussed cessation/primary prevention of drug use and availability of treatment for abuse.   - Stressed the importance of regular exercise - Injury prevention: Discussed safety belts, safety helmets, smoke detector, smoking near bedding or upholstery.  - Dental health: Discussed importance of regular tooth brushing, flossing, and dental visits.  - Sexuality: Discussed sexually transmitted diseases, partner selection, use of condoms, avoidance of unintended pregnancy  and contraceptive alternatives.   NEXT PREVENTATIVE PHYSICAL DUE IN 1 YEAR.  Return in about 6 months (around 01/02/2024) for Chronic Condition follow up.  Gavin Kast, FNP

## 2023-07-03 LAB — HEPATITIS C ANTIBODY: Hepatitis C Ab: NONREACTIVE

## 2023-07-06 ENCOUNTER — Other Ambulatory Visit: Payer: Self-pay | Admitting: Internal Medicine

## 2023-07-06 ENCOUNTER — Telehealth: Payer: Self-pay

## 2023-07-06 DIAGNOSIS — Z87891 Personal history of nicotine dependence: Secondary | ICD-10-CM

## 2023-07-06 NOTE — Telephone Encounter (Signed)
CT order changed.

## 2023-07-06 NOTE — Telephone Encounter (Signed)
 Gurney Lefort informed. No further action at this time.

## 2023-07-06 NOTE — Telephone Encounter (Signed)
 Copied from CRM [id number]. Topic: Clinical - Request for Lab/Test Order >> Jul 06, 2023 10:22 AM Allyne Areola wrote: Reason for CRM: Patient was ordered a CT chest lung cancer screening but he does not qualify. Gurney Lefort from [facility] is calling to see if the order can be changed to a CT chest without contrast. Best call back number [phone] option 1 then option 4.

## 2023-07-09 ENCOUNTER — Telehealth: Payer: Self-pay

## 2023-07-09 ENCOUNTER — Encounter: Payer: Self-pay | Admitting: Internal Medicine

## 2023-07-09 ENCOUNTER — Ambulatory Visit: Payer: Self-pay | Admitting: Internal Medicine

## 2023-07-09 NOTE — Telephone Encounter (Signed)
 Can you help with this  Copied from CRM [id number]. Topic: Referral - Prior Authorization Question >> Jul 06, 2023  4:44 PM [place] J wrote: Reason for CRM: Soyla Duverney calling from [facility] wondering if the patient's recent imaging orders are needing a prior authorization completed.  Callback Number: [phone] [phone]

## 2023-07-10 ENCOUNTER — Telehealth: Payer: Self-pay

## 2023-07-10 NOTE — Telephone Encounter (Signed)
 Message routed to PA team Copied from CRM [id number]. Topic: General - Other >> Jul 10, 2023  2:42 PM Freya Jesus wrote: Reason for CRM: Charlottle from [facility] - [phone] ext [phone] - called to let us  know patient appointment was cancelled due to not receiving prior authorization.

## 2023-07-11 ENCOUNTER — Other Ambulatory Visit

## 2023-07-15 LAB — COLOGUARD: COLOGUARD: NEGATIVE
